# Patient Record
Sex: Female | Born: 1965
Health system: Southern US, Community
[De-identification: ages and names within clinical notes are randomized; demographics above are authoritative.]

## PROBLEM LIST (undated history)

## (undated) DIAGNOSIS — K219 Gastro-esophageal reflux disease without esophagitis: Secondary | ICD-10-CM

## (undated) DIAGNOSIS — C801 Malignant (primary) neoplasm, unspecified: Secondary | ICD-10-CM

## (undated) DIAGNOSIS — M5416 Radiculopathy, lumbar region: Secondary | ICD-10-CM

## (undated) DIAGNOSIS — Z87442 Personal history of urinary calculi: Secondary | ICD-10-CM

## (undated) DIAGNOSIS — M960 Pseudarthrosis after fusion or arthrodesis: Secondary | ICD-10-CM

## (undated) DIAGNOSIS — Z981 Arthrodesis status: Secondary | ICD-10-CM

## (undated) DIAGNOSIS — E785 Hyperlipidemia, unspecified: Secondary | ICD-10-CM

## (undated) DIAGNOSIS — M1990 Unspecified osteoarthritis, unspecified site: Secondary | ICD-10-CM

## (undated) DIAGNOSIS — D649 Anemia, unspecified: Secondary | ICD-10-CM

## (undated) DIAGNOSIS — R51 Headache: Secondary | ICD-10-CM

## (undated) DIAGNOSIS — T4145XA Adverse effect of unspecified anesthetic, initial encounter: Secondary | ICD-10-CM

## (undated) DIAGNOSIS — T753XXA Motion sickness, initial encounter: Secondary | ICD-10-CM

## (undated) DIAGNOSIS — T8859XA Other complications of anesthesia, initial encounter: Secondary | ICD-10-CM

## (undated) DIAGNOSIS — Z87898 Personal history of other specified conditions: Secondary | ICD-10-CM

## (undated) DIAGNOSIS — N189 Chronic kidney disease, unspecified: Secondary | ICD-10-CM

## (undated) DIAGNOSIS — F419 Anxiety disorder, unspecified: Secondary | ICD-10-CM

## (undated) DIAGNOSIS — G43909 Migraine, unspecified, not intractable, without status migrainosus: Secondary | ICD-10-CM

## (undated) DIAGNOSIS — Z9889 Other specified postprocedural states: Secondary | ICD-10-CM

## (undated) DIAGNOSIS — R519 Headache, unspecified: Secondary | ICD-10-CM

## (undated) DIAGNOSIS — R112 Nausea with vomiting, unspecified: Secondary | ICD-10-CM

## (undated) HISTORY — PX: APPENDECTOMY: SHX54

## (undated) HISTORY — PX: LUMBAR SPINE SURGERY: SHX701

## (undated) HISTORY — PX: TONSILLECTOMY: SUR1361

## (undated) HISTORY — PX: RECTOCELE REPAIR: SHX761

## (undated) HISTORY — PX: COLONOSCOPY: SHX174

## (undated) HISTORY — PX: ABDOMINAL HYSTERECTOMY: SHX81

---

## 2004-04-14 ENCOUNTER — Observation Stay (HOSPITAL_COMMUNITY): Admission: RE | Admit: 2004-04-14 | Discharge: 2004-04-15 | Payer: Self-pay | Admitting: Gynecology

## 2007-10-23 ENCOUNTER — Ambulatory Visit: Payer: Self-pay | Admitting: Internal Medicine

## 2009-11-16 ENCOUNTER — Ambulatory Visit: Payer: Self-pay | Admitting: Internal Medicine

## 2010-12-21 ENCOUNTER — Ambulatory Visit: Payer: Self-pay | Admitting: Internal Medicine

## 2011-12-24 ENCOUNTER — Ambulatory Visit: Payer: Self-pay | Admitting: Internal Medicine

## 2013-12-04 ENCOUNTER — Ambulatory Visit: Payer: Self-pay | Admitting: Internal Medicine

## 2014-11-08 ENCOUNTER — Other Ambulatory Visit: Payer: Self-pay | Admitting: Internal Medicine

## 2014-11-08 DIAGNOSIS — Z1231 Encounter for screening mammogram for malignant neoplasm of breast: Secondary | ICD-10-CM

## 2014-12-06 ENCOUNTER — Ambulatory Visit
Admission: RE | Admit: 2014-12-06 | Discharge: 2014-12-06 | Disposition: A | Discharge status: Home / Self Care | Payer: 59 | Source: Ambulatory Visit | Attending: Internal Medicine | Admitting: Internal Medicine

## 2014-12-06 DIAGNOSIS — Z1231 Encounter for screening mammogram for malignant neoplasm of breast: Secondary | ICD-10-CM | POA: Insufficient documentation

## 2015-04-22 ENCOUNTER — Other Ambulatory Visit: Payer: Self-pay | Admitting: Internal Medicine

## 2015-04-22 DIAGNOSIS — R1011 Right upper quadrant pain: Secondary | ICD-10-CM

## 2015-05-04 ENCOUNTER — Ambulatory Visit
Admission: RE | Admit: 2015-05-04 | Discharge: 2015-05-04 | Disposition: A | Discharge status: Home / Self Care | Payer: 59 | Source: Ambulatory Visit | Attending: Internal Medicine | Admitting: Internal Medicine

## 2015-05-04 DIAGNOSIS — K8020 Calculus of gallbladder without cholecystitis without obstruction: Secondary | ICD-10-CM | POA: Insufficient documentation

## 2015-05-04 DIAGNOSIS — K7689 Other specified diseases of liver: Secondary | ICD-10-CM | POA: Diagnosis not present

## 2015-05-04 DIAGNOSIS — R1011 Right upper quadrant pain: Secondary | ICD-10-CM | POA: Insufficient documentation

## 2015-06-09 ENCOUNTER — Encounter: Payer: Self-pay | Admitting: *Deleted

## 2015-06-09 ENCOUNTER — Other Ambulatory Visit: Payer: 59

## 2015-06-09 NOTE — Patient Instructions (Signed)
  Your procedure is scheduled on: 06-16-15 Report to [place] To find out your arrival time please call [phone] between 1PM - 3PM on 06-15-15  Remember: Instructions that are not followed completely may result in serious medical risk, up to and including death, or upon the discretion of your surgeon and anesthesiologist your surgery may need to be rescheduled.    _X___ 1. Do not eat food or drink liquids after midnight. No gum chewing or hard candies.     _X___ 2. No Alcohol for 24 hours before or after surgery.   ____ 3. Bring all medications with you on the day of surgery if instructed.    ____ 4. Notify your doctor if there is any change in your medical condition     (cold, fever, infections).     Do not wear jewelry, make-up, hairpins, clips or nail polish.  Do not wear lotions, powders, or perfumes. You may wear deodorant.  Do not shave 48 hours prior to surgery. Men may shave face and neck.  Do not bring valuables to the hospital.    [facility] is not responsible for any belongings or valuables.               Contacts, dentures or bridgework may not be worn into surgery.  Leave your suitcase in the car. After surgery it may be brought to your room.  For patients admitted to the hospital, discharge time is determined by your treatment team.   Patients discharged the day of surgery will not be allowed to drive home.   Please read over the following fact sheets that you were given:     _X___ Take these medicines the morning of surgery with A SIP OF WATER:    1. PROTONIX  2.   3.   4.  5.  6.  ____ Fleet Enema (as directed)   ____ Use CHG Soap as directed  ____ Use inhalers on the day of surgery  ____ Stop metformin 2 days prior to surgery    ____ Take 1/2 of usual insulin dose the night before surgery and none on the morning of surgery.   ____ Stop Coumadin/Plavix/aspirin-N/A  _X___ Stop Anti-inflammatories-NO NSAIDS OR ASA  PRODUCTS-TYLENOL OK TO TAKE   _X___ Stop supplements until after surgery-STOP TRUBIOTIC NOW   ____ Bring C-Pap to the hospital.

## 2015-06-16 ENCOUNTER — Ambulatory Visit: Payer: Commercial Managed Care - HMO | Admitting: Certified Registered"

## 2015-06-16 ENCOUNTER — Ambulatory Visit: Payer: Commercial Managed Care - HMO

## 2015-06-16 ENCOUNTER — Encounter
Admission: RE | Disposition: A | Discharge status: Home / Self Care | Payer: Self-pay | Source: Ambulatory Visit | Attending: Surgery

## 2015-06-16 ENCOUNTER — Ambulatory Visit
Admission: RE | Admit: 2015-06-16 | Discharge: 2015-06-16 | Disposition: A | Discharge status: Home / Self Care | Payer: Commercial Managed Care - HMO | Source: Ambulatory Visit | Attending: Surgery | Admitting: Surgery

## 2015-06-16 DIAGNOSIS — Z862 Personal history of diseases of the blood and blood-forming organs and certain disorders involving the immune mechanism: Secondary | ICD-10-CM | POA: Diagnosis not present

## 2015-06-16 DIAGNOSIS — K219 Gastro-esophageal reflux disease without esophagitis: Secondary | ICD-10-CM | POA: Diagnosis not present

## 2015-06-16 DIAGNOSIS — K8010 Calculus of gallbladder with chronic cholecystitis without obstruction: Secondary | ICD-10-CM | POA: Diagnosis not present

## 2015-06-16 DIAGNOSIS — F419 Anxiety disorder, unspecified: Secondary | ICD-10-CM | POA: Diagnosis not present

## 2015-06-16 DIAGNOSIS — K819 Cholecystitis, unspecified: Secondary | ICD-10-CM

## 2015-06-16 HISTORY — DX: Gastro-esophageal reflux disease without esophagitis: K21.9

## 2015-06-16 HISTORY — DX: Unspecified osteoarthritis, unspecified site: M19.90

## 2015-06-16 HISTORY — DX: Headache: R51

## 2015-06-16 HISTORY — DX: Adverse effect of unspecified anesthetic, initial encounter: T41.45XA

## 2015-06-16 HISTORY — PX: CHOLECYSTECTOMY: SHX55

## 2015-06-16 HISTORY — DX: Hyperlipidemia, unspecified: E78.5

## 2015-06-16 HISTORY — DX: Anemia, unspecified: D64.9

## 2015-06-16 HISTORY — DX: Anxiety disorder, unspecified: F41.9

## 2015-06-16 HISTORY — DX: Radiculopathy, lumbar region: M54.16

## 2015-06-16 HISTORY — DX: Chronic kidney disease, unspecified: N18.9

## 2015-06-16 HISTORY — DX: Other complications of anesthesia, initial encounter: T88.59XA

## 2015-06-16 HISTORY — DX: Other specified postprocedural states: Z98.890

## 2015-06-16 HISTORY — DX: Other specified postprocedural states: R11.2

## 2015-06-16 HISTORY — DX: Headache, unspecified: R51.9

## 2015-06-16 SURGERY — LAPAROSCOPIC CHOLECYSTECTOMY WITH INTRAOPERATIVE CHOLANGIOGRAM
Anesthesia: General | Wound class: Clean Contaminated

## 2015-06-16 MED ORDER — PROPOFOL 10 MG/ML IV BOLUS
INTRAVENOUS | Status: DC | PRN
  Administered 2015-06-16: 130 mg via INTRAVENOUS
  Administered 2015-06-16: 40 mg via INTRAVENOUS

## 2015-06-16 MED ORDER — MIDAZOLAM HCL 2 MG/2ML IJ SOLN
INTRAMUSCULAR | Status: DC | PRN
  Administered 2015-06-16: 2 mg via INTRAVENOUS

## 2015-06-16 MED ORDER — ONDANSETRON HCL 4 MG/2ML IJ SOLN
INTRAMUSCULAR | Status: DC | PRN
  Administered 2015-06-16: 4 mg via INTRAVENOUS

## 2015-06-16 MED ORDER — HYDROCODONE-ACETAMINOPHEN 5-325 MG PO TABS: 1.0000 | ORAL_TABLET | ORAL | Status: DC | PRN

## 2015-06-16 MED ORDER — ROCURONIUM BROMIDE 100 MG/10ML IV SOLN
INTRAVENOUS | Status: DC | PRN
  Administered 2015-06-16 (×2): 5 mg via INTRAVENOUS
  Administered 2015-06-16: 35 mg via INTRAVENOUS
  Administered 2015-06-16: 5 mg via INTRAVENOUS

## 2015-06-16 MED ORDER — PHENYLEPHRINE HCL 10 MG/ML IJ SOLN
INTRAMUSCULAR | Status: DC | PRN
  Administered 2015-06-16: 50 ug via INTRAVENOUS

## 2015-06-16 MED ORDER — FENTANYL CITRATE (PF) 100 MCG/2ML IJ SOLN
INTRAMUSCULAR | Status: AC
  Filled 2015-06-16: qty 2

## 2015-06-16 MED ORDER — ONDANSETRON HCL 4 MG/2ML IJ SOLN
INTRAMUSCULAR | Status: AC
  Filled 2015-06-16: qty 2

## 2015-06-16 MED ORDER — PROMETHAZINE HCL 12.5 MG PO TABS: 12.5000 mg | ORAL_TABLET | ORAL | Status: DC | PRN

## 2015-06-16 MED ORDER — LACTATED RINGERS IV SOLN
INTRAVENOUS | Status: DC
  Administered 2015-06-16: 07:00:00 via INTRAVENOUS

## 2015-06-16 MED ORDER — EPHEDRINE SULFATE 50 MG/ML IJ SOLN
INTRAMUSCULAR | Status: DC | PRN
  Administered 2015-06-16: 10 mg via INTRAVENOUS

## 2015-06-16 MED ORDER — FENTANYL CITRATE (PF) 100 MCG/2ML IJ SOLN
25.0000 ug | INTRAMUSCULAR | Status: DC | PRN
  Administered 2015-06-16 (×2): 25 ug via INTRAVENOUS

## 2015-06-16 MED ORDER — PROMETHAZINE HCL 25 MG PO TABS
25.0000 mg | ORAL_TABLET | Freq: Once | ORAL | Status: AC
  Administered 2015-06-16: 25 mg via ORAL
  Filled 2015-06-16: qty 1

## 2015-06-16 MED ORDER — ONDANSETRON HCL 4 MG/2ML IJ SOLN
4.0000 mg | Freq: Once | INTRAMUSCULAR | Status: AC | PRN
  Administered 2015-06-16: 4 mg via INTRAVENOUS

## 2015-06-16 MED ORDER — HEPARIN SODIUM (PORCINE) 5000 UNIT/ML IJ SOLN
INTRAMUSCULAR | Status: AC
  Filled 2015-06-16: qty 1

## 2015-06-16 MED ORDER — GLYCOPYRROLATE 0.2 MG/ML IJ SOLN
INTRAMUSCULAR | Status: DC | PRN
  Administered 2015-06-16: 0.2 mg via INTRAVENOUS

## 2015-06-16 MED ORDER — DEXAMETHASONE SODIUM PHOSPHATE 10 MG/ML IJ SOLN
INTRAMUSCULAR | Status: DC | PRN
  Administered 2015-06-16: 10 mg via INTRAVENOUS

## 2015-06-16 MED ORDER — SUGAMMADEX SODIUM 200 MG/2ML IV SOLN
INTRAVENOUS | Status: DC | PRN
  Administered 2015-06-16: 150 mg via INTRAVENOUS

## 2015-06-16 MED ORDER — FENTANYL CITRATE (PF) 100 MCG/2ML IJ SOLN
INTRAMUSCULAR | Status: DC | PRN
  Administered 2015-06-16 (×5): 50 ug via INTRAVENOUS

## 2015-06-16 MED ORDER — LIDOCAINE HCL (CARDIAC) 20 MG/ML IV SOLN
INTRAVENOUS | Status: DC | PRN
  Administered 2015-06-16: 60 mg via INTRAVENOUS

## 2015-06-16 SURGICAL SUPPLY — 39 items
APPLIER CLIP ROT 10 11.4 M/L (STAPLE) ×2
APR CLP MED LRG 11.4X10 (STAPLE) ×1
CANISTER SUCT 1200ML W/VALVE (MISCELLANEOUS) ×2 IMPLANT
CANNULA DILATOR 10 W/SLV (CANNULA) ×2 IMPLANT
CATH REDDICK CHOLANGI 4FR 50CM (CATHETERS) ×2 IMPLANT
CHLORAPREP W/TINT 26ML (MISCELLANEOUS) ×2 IMPLANT
CHOLANGIOGRAM CATH TAUT (CATHETERS) ×1 IMPLANT
CLIP APPLIE ROT 10 11.4 M/L (STAPLE) ×1 IMPLANT
DRAPE SHEET LG 3/4 BI-LAMINATE (DRAPES) ×2 IMPLANT
ELECT REM PT RETURN 9FT ADLT (ELECTROSURGICAL) ×2
ELECTRODE REM PT RTRN 9FT ADLT (ELECTROSURGICAL) ×1 IMPLANT
GAUZE SPONGE 4X4 12PLY STRL (GAUZE/BANDAGES/DRESSINGS) ×2 IMPLANT
GLOVE BIO SURGEON STRL SZ7.5 (GLOVE) ×2 IMPLANT
GOWN STRL REUS W/ TWL LRG LVL3 (GOWN DISPOSABLE) ×4 IMPLANT
GOWN STRL REUS W/TWL LRG LVL3 (GOWN DISPOSABLE) ×8
IRRIGATION STRYKERFLOW (MISCELLANEOUS) ×1 IMPLANT
IRRIGATOR STRYKERFLOW (MISCELLANEOUS) ×2
IV NS 1000ML (IV SOLUTION) ×2
IV NS 1000ML BAXH (IV SOLUTION) ×1 IMPLANT
KIT RM TURNOVER STRD PROC AR (KITS) ×2 IMPLANT
LABEL OR SOLS (LABEL) ×2 IMPLANT
LIQUID BAND (GAUZE/BANDAGES/DRESSINGS) ×1 IMPLANT
NDL FILTER BLUNT 18X1 1/2 (NEEDLE) ×1 IMPLANT
NDL INSUFF ACCESS 14 VERSASTEP (NEEDLE) ×2 IMPLANT
NEEDLE FILTER BLUNT 18X 1/2SAF (NEEDLE) ×1
NEEDLE FILTER BLUNT 18X1 1/2 (NEEDLE) ×1 IMPLANT
NS IRRIG 500ML POUR BTL (IV SOLUTION) ×2 IMPLANT
PACK LAP CHOLECYSTECTOMY (MISCELLANEOUS) ×2 IMPLANT
SCISSORS METZENBAUM CVD 33 (INSTRUMENTS) ×2 IMPLANT
SEAL FOR SCOPE WARMER C3101 (MISCELLANEOUS) ×2 IMPLANT
SLEEVE ENDOPATH XCEL 5M (ENDOMECHANICALS) ×2 IMPLANT
STRIP CLOSURE SKIN 1/2X4 (GAUZE/BANDAGES/DRESSINGS) ×2 IMPLANT
SUT CHROMIC 5 0 RB 1 27 (SUTURE) ×2 IMPLANT
SUT VIC AB 0 CT2 27 (SUTURE) IMPLANT
SYR 3ML LL SCALE MARK (SYRINGE) ×2 IMPLANT
TROCAR XCEL NON-BLD 11X100MML (ENDOMECHANICALS) ×2 IMPLANT
TROCAR XCEL NON-BLD 5MMX100MML (ENDOMECHANICALS) ×2 IMPLANT
TUBING INSUFFLATOR HI FLOW (MISCELLANEOUS) ×2 IMPLANT
WATER STERILE IRR 1000ML POUR (IV SOLUTION) ×2 IMPLANT

## 2015-06-16 NOTE — H&P (Signed)
  She continues to have some epigastric pains.  No change in condition since office exam.  Labs noted.  Discussed plan for lap cholecystectomy.

## 2015-06-16 NOTE — Transfer of Care (Signed)
Immediate Anesthesia Transfer of Care Note  Patient: Kristin Thomas  Procedure(s) Performed: Procedure(s): LAPAROSCOPIC CHOLECYSTECTOMY WITH INTRAOPERATIVE CHOLANGIOGRAM (N/A)  Patient Location: PACU  Anesthesia Type:General  Level of Consciousness: awake, alert  and oriented  Airway & Oxygen Therapy: Patient Spontanous Breathing and Patient connected to nasal cannula oxygen  Post-op Assessment: Report given to RN and Post -op Vital signs reviewed and stable  Post vital signs: Reviewed and stable  Last Vitals:  Filed Vitals:   06/16/15 0611 06/16/15 0907  BP: 130/77 142/79  Pulse: 67 91  Temp: 36.7 C 36.4 C  Resp: 16 13    Last Pain: There were no vitals filed for this visit.       Complications: No apparent anesthesia complications

## 2015-06-16 NOTE — Anesthesia Procedure Notes (Signed)
Procedure Name: Intubation Date/Time: 06/16/2015 7:32 AM Performed by: Jenetta Downer Pre-anesthesia Checklist: Patient identified, Emergency Drugs available, Suction available and Patient being monitored Patient Re-evaluated:Patient Re-evaluated prior to inductionOxygen Delivery Method: Circle system utilized Preoxygenation: Pre-oxygenation with 100% oxygen Intubation Type: IV induction Ventilation: Mask ventilation without difficulty Laryngoscope Size: Miller and 2 Grade View: Grade I Tube type: Oral Tube size: 6.5 mm Number of attempts: 1 Airway Equipment and Method: Stylet Placement Confirmation: ETT inserted through vocal cords under direct vision,  positive ETCO2 and breath sounds checked- equal and bilateral Secured at: 22 cm Tube secured with: Tape

## 2015-06-16 NOTE — Op Note (Signed)
OPERATIVE REPORT  PREOPERATIVE DIAGNOSIS:  Chronic cholecystitis cholelithiasis  POSTOPERATIVE DIAGNOSIS: Chronic cholecystitis cholelithiasis  PROCEDURE: Laparoscopic cholecystectomy with cholangiogram  ANESTHESIA: General  SURGEON: Rochel Brome M.D.  INDICATIONS: She has a history of epigastric pain and ultrasound findings of gallstones.    With the patient on the operating table in the supine position under general endotracheal anesthesia the abdomen was prepared with ChloraPrep solution and draped in a sterile manner. A short incision was made in the inferior aspect of the umbilicus and carried down to the deep fascia which was grasped with a laryngeal hook. A Veress needle was inserted aspirated and irrigated with a saline solution. The peritoneal cavity was insufflated with carbon dioxide. The Veress needle was removed. The 10 mm cannula was inserted. The 10 mm 0 laparoscope was inserted to view the peritoneal cavity.  Another incision was made in the epigastrium slightly to the right of the midline to introduce an 11 mm cannula. 2 incisions were made in the lateral aspect of the right upper quadrant to introduce 2   5 mm cannulas. Initial inspection revealed the smooth surface of the liver. There were a few adhesions between the omentum and the lower abdominal wall. The gallbladder was retracted towards the right shoulder. A number of adhesions were taken down with hook and cautery and blunt dissection.  The gallbladder neck was retracted inferiorly and laterally.  The porta hepatis was identified. The gallbladder was mobilized with incision of the visceral peritoneum. The cystic duct was dissected free from surrounding structures. The cystic artery was dissected free from surrounding structures. A critical view of safety was demonstrated  An Endo Clip was placed across the cystic duct adjacent to the gallbladder neck. An incision was made in the cystic duct to introduce a Reddick catheter.  The cholangiogram was done with injection of half-strength Conray 60 dye. This demonstrated the bile ducts and flow of dye into the duodenum. No retained stones were identified. The cholangiogram appeared normal. The Reddick catheter was removed. The cystic duct was doubly ligated with endoclips and divided. The cystic artery was controlled with double endoclips and divided. The gallbladder was dissected free from the liver with use of hook and cautery and blunt dissection. Bleeding was minimal and hemostasis was intact. The gallbladder was delivered up through the infraumbilical incision opened and suctioned.  Multiple stones were removed with the stone forceps and stone scoop. The gallbladder was submitted with stones in formalin for routine pathology. The skin incisions were closed with interrupted 5-0 chromic subcutaneous suture benzoin and Steri-Strips. Gauze dressings were applied with paper tape.  The patient appeared to be in satisfactory condition and was prepared for transfer to the recovery room  [place].D.

## 2015-06-16 NOTE — Anesthesia Preprocedure Evaluation (Signed)
Anesthesia Evaluation  Patient identified by MRN, date of birth, ID band Patient awake    History of Anesthesia Complications (+) PONV  Airway Mallampati: II       Dental  (+) Teeth Intact   Pulmonary neg pulmonary ROS,    breath sounds clear to auscultation       Cardiovascular Exercise Tolerance: Good  Rhythm:Regular Rate:Normal     Neuro/Psych  Headaches, Anxiety    GI/Hepatic Neg liver ROS, GERD  Medicated,  Endo/Other  negative endocrine ROS  Renal/GU negative Renal ROS     Musculoskeletal   Abdominal Normal abdominal exam  (+)   Peds  Hematology  (+) anemia ,   Anesthesia Other Findings   Reproductive/Obstetrics                             Anesthesia Physical Anesthesia Plan  ASA: II  Anesthesia Plan: General   Post-op Pain Management:    Induction: Intravenous  Airway Management Planned: Oral ETT  Additional Equipment:   Intra-op Plan:   Post-operative Plan: Extubation in OR  Informed Consent: I have reviewed the patients History and Physical, chart, labs and discussed the procedure including the risks, benefits and alternatives for the proposed anesthesia with the patient or authorized representative who has indicated his/her understanding and acceptance.     Plan Discussed with: CRNA  Anesthesia Plan Comments:         Anesthesia Quick Evaluation

## 2015-06-16 NOTE — Discharge Instructions (Addendum)
Take Tylenol or Norco if needed for pain.  Take Phenergan 1 tablet each 4 hours as needed for nausea.  Remove dressings on Friday. May shower Saturday.  Avoid straining and heavy lifting for the first week after surgery  .AMBULATORY SURGERY  DISCHARGE INSTRUCTIONS   1) The drugs that you were given will stay in your system until tomorrow so for the next 24 hours you should not:  A) Drive an automobile B) Make any legal decisions C) Drink any alcoholic beverage   2) You may resume regular meals tomorrow.  Today it is better to start with liquids and gradually work up to solid foods.  You may eat anything you prefer, but it is better to start with liquids, then soup and crackers, and gradually work up to solid foods.   3) Please notify your doctor immediately if you have any unusual bleeding, trouble breathing, redness and pain at the surgery site, drainage, fever, or pain not relieved by medication.    4) Additional Instructions:        Please contact your physician with any problems or Same Day Surgery at [phone], Monday through Friday 6 am to 4 pm, or [place] [facility] number at [phone].

## 2015-06-16 NOTE — Anesthesia Postprocedure Evaluation (Signed)
Anesthesia Post Note  Patient: Kristin Thomas  Procedure(s) Performed: Procedure(s) (LRB): LAPAROSCOPIC CHOLECYSTECTOMY WITH INTRAOPERATIVE CHOLANGIOGRAM (N/A)  Patient location during evaluation: PACU Anesthesia Type: General Level of consciousness: awake Pain management: pain level controlled Vital Signs Assessment: post-procedure vital signs reviewed and stable Respiratory status: spontaneous breathing Cardiovascular status: blood pressure returned to baseline Anesthetic complications: no    Last Vitals:  Filed Vitals:   06/16/15 0907 06/16/15 0915  BP: 142/79   Pulse: 91 71  Temp: 36.4 C   Resp: 13 9    Last Pain:  Filed Vitals:   06/16/15 0923  PainSc: 5                  VAN STAVEREN,Violetta Lavalle

## 2015-06-16 NOTE — OR Nursing (Signed)
Discharge instructions given to significant other Luna Kitchens

## 2015-06-17 LAB — SURGICAL PATHOLOGY

## 2015-11-17 ENCOUNTER — Other Ambulatory Visit: Payer: Self-pay | Admitting: Internal Medicine

## 2015-11-17 DIAGNOSIS — Z1231 Encounter for screening mammogram for malignant neoplasm of breast: Secondary | ICD-10-CM

## 2015-11-30 ENCOUNTER — Other Ambulatory Visit: Payer: Self-pay | Admitting: Gastroenterology

## 2015-11-30 DIAGNOSIS — K219 Gastro-esophageal reflux disease without esophagitis: Secondary | ICD-10-CM

## 2015-11-30 DIAGNOSIS — R1310 Dysphagia, unspecified: Secondary | ICD-10-CM

## 2015-12-02 ENCOUNTER — Ambulatory Visit
Admission: RE | Admit: 2015-12-02 | Discharge: 2015-12-02 | Disposition: A | Discharge status: Home / Self Care | Payer: Commercial Managed Care - HMO | Source: Ambulatory Visit | Attending: Gastroenterology | Admitting: Gastroenterology

## 2015-12-02 DIAGNOSIS — K219 Gastro-esophageal reflux disease without esophagitis: Secondary | ICD-10-CM | POA: Insufficient documentation

## 2015-12-02 DIAGNOSIS — R1310 Dysphagia, unspecified: Secondary | ICD-10-CM | POA: Diagnosis not present

## 2015-12-20 ENCOUNTER — Ambulatory Visit
Admission: RE | Admit: 2015-12-20 | Discharge: 2015-12-20 | Disposition: A | Discharge status: Home / Self Care | Payer: Commercial Managed Care - HMO | Source: Ambulatory Visit | Attending: Internal Medicine | Admitting: Internal Medicine

## 2015-12-20 DIAGNOSIS — Z1231 Encounter for screening mammogram for malignant neoplasm of breast: Secondary | ICD-10-CM | POA: Insufficient documentation

## 2016-03-07 DIAGNOSIS — J40 Bronchitis, not specified as acute or chronic: Secondary | ICD-10-CM | POA: Diagnosis not present

## 2016-03-07 DIAGNOSIS — J4522 Mild intermittent asthma with status asthmaticus: Secondary | ICD-10-CM | POA: Diagnosis not present

## 2016-03-08 ENCOUNTER — Encounter: Admission: RE | Payer: Self-pay | Source: Ambulatory Visit

## 2016-03-08 ENCOUNTER — Ambulatory Visit: Admission: RE | Admit: 2016-03-08 | Payer: 59 | Source: Ambulatory Visit | Admitting: Unknown Physician Specialty

## 2016-03-08 DIAGNOSIS — Z1211 Encounter for screening for malignant neoplasm of colon: Secondary | ICD-10-CM | POA: Diagnosis not present

## 2016-03-08 SURGERY — ESOPHAGOGASTRODUODENOSCOPY (EGD) WITH PROPOFOL
Anesthesia: General

## 2016-03-23 ENCOUNTER — Encounter: Admission: RE | Payer: Self-pay | Source: Ambulatory Visit

## 2016-03-23 ENCOUNTER — Ambulatory Visit
Admission: RE | Admit: 2016-03-23 | Payer: Commercial Managed Care - HMO | Source: Ambulatory Visit | Admitting: Gastroenterology

## 2016-03-23 SURGERY — COLONOSCOPY WITH PROPOFOL
Anesthesia: General

## 2016-04-05 DIAGNOSIS — K2950 Unspecified chronic gastritis without bleeding: Secondary | ICD-10-CM | POA: Diagnosis not present

## 2016-04-05 DIAGNOSIS — R1310 Dysphagia, unspecified: Secondary | ICD-10-CM | POA: Diagnosis not present

## 2016-04-05 DIAGNOSIS — K449 Diaphragmatic hernia without obstruction or gangrene: Secondary | ICD-10-CM | POA: Diagnosis not present

## 2016-04-05 DIAGNOSIS — K2980 Duodenitis without bleeding: Secondary | ICD-10-CM | POA: Diagnosis not present

## 2016-04-05 DIAGNOSIS — K2960 Other gastritis without bleeding: Secondary | ICD-10-CM | POA: Diagnosis not present

## 2016-05-22 DIAGNOSIS — R51 Headache: Secondary | ICD-10-CM | POA: Diagnosis not present

## 2016-05-22 DIAGNOSIS — M9902 Segmental and somatic dysfunction of thoracic region: Secondary | ICD-10-CM | POA: Diagnosis not present

## 2016-05-22 DIAGNOSIS — M9901 Segmental and somatic dysfunction of cervical region: Secondary | ICD-10-CM | POA: Diagnosis not present

## 2016-06-26 DIAGNOSIS — R51 Headache: Secondary | ICD-10-CM | POA: Diagnosis not present

## 2016-06-26 DIAGNOSIS — M9901 Segmental and somatic dysfunction of cervical region: Secondary | ICD-10-CM | POA: Diagnosis not present

## 2016-06-26 DIAGNOSIS — M9902 Segmental and somatic dysfunction of thoracic region: Secondary | ICD-10-CM | POA: Diagnosis not present

## 2016-07-24 DIAGNOSIS — M9901 Segmental and somatic dysfunction of cervical region: Secondary | ICD-10-CM | POA: Diagnosis not present

## 2016-07-24 DIAGNOSIS — M9902 Segmental and somatic dysfunction of thoracic region: Secondary | ICD-10-CM | POA: Diagnosis not present

## 2016-07-24 DIAGNOSIS — R51 Headache: Secondary | ICD-10-CM | POA: Diagnosis not present

## 2016-08-01 DIAGNOSIS — R51 Headache: Secondary | ICD-10-CM | POA: Diagnosis not present

## 2016-08-01 DIAGNOSIS — M9902 Segmental and somatic dysfunction of thoracic region: Secondary | ICD-10-CM | POA: Diagnosis not present

## 2016-08-01 DIAGNOSIS — M9901 Segmental and somatic dysfunction of cervical region: Secondary | ICD-10-CM | POA: Diagnosis not present

## 2016-08-09 ENCOUNTER — Ambulatory Visit
Admission: RE | Admit: 2016-08-09 | Discharge: 2016-08-09 | Disposition: A | Discharge status: Home / Self Care | Payer: Commercial Managed Care - HMO | Source: Ambulatory Visit | Attending: Physician Assistant | Admitting: Physician Assistant

## 2016-08-09 ENCOUNTER — Other Ambulatory Visit: Payer: Self-pay | Admitting: Physician Assistant

## 2016-08-09 DIAGNOSIS — N200 Calculus of kidney: Secondary | ICD-10-CM | POA: Diagnosis not present

## 2016-08-09 DIAGNOSIS — R319 Hematuria, unspecified: Secondary | ICD-10-CM | POA: Diagnosis present

## 2016-08-09 DIAGNOSIS — R1031 Right lower quadrant pain: Secondary | ICD-10-CM | POA: Diagnosis present

## 2016-08-09 DIAGNOSIS — M545 Low back pain, unspecified: Secondary | ICD-10-CM

## 2016-08-09 DIAGNOSIS — R300 Dysuria: Secondary | ICD-10-CM | POA: Diagnosis not present

## 2016-11-02 DIAGNOSIS — Z0000 Encounter for general adult medical examination without abnormal findings: Secondary | ICD-10-CM | POA: Diagnosis not present

## 2016-11-13 ENCOUNTER — Other Ambulatory Visit: Payer: Self-pay | Admitting: Internal Medicine

## 2016-11-13 DIAGNOSIS — Z0000 Encounter for general adult medical examination without abnormal findings: Secondary | ICD-10-CM | POA: Diagnosis not present

## 2016-11-13 DIAGNOSIS — M5116 Intervertebral disc disorders with radiculopathy, lumbar region: Secondary | ICD-10-CM | POA: Diagnosis not present

## 2016-11-13 DIAGNOSIS — Z1231 Encounter for screening mammogram for malignant neoplasm of breast: Secondary | ICD-10-CM

## 2016-11-13 DIAGNOSIS — Z23 Encounter for immunization: Secondary | ICD-10-CM | POA: Diagnosis not present

## 2016-11-13 DIAGNOSIS — K219 Gastro-esophageal reflux disease without esophagitis: Secondary | ICD-10-CM | POA: Diagnosis not present

## 2016-12-13 DIAGNOSIS — Z23 Encounter for immunization: Secondary | ICD-10-CM | POA: Diagnosis not present

## 2016-12-13 DIAGNOSIS — D500 Iron deficiency anemia secondary to blood loss (chronic): Secondary | ICD-10-CM | POA: Diagnosis not present

## 2016-12-13 DIAGNOSIS — K219 Gastro-esophageal reflux disease without esophagitis: Secondary | ICD-10-CM | POA: Diagnosis not present

## 2016-12-20 ENCOUNTER — Ambulatory Visit
Admission: RE | Admit: 2016-12-20 | Discharge: 2016-12-20 | Disposition: A | Discharge status: Home / Self Care | Payer: 59 | Source: Ambulatory Visit | Attending: Internal Medicine | Admitting: Internal Medicine

## 2016-12-20 DIAGNOSIS — Z1231 Encounter for screening mammogram for malignant neoplasm of breast: Secondary | ICD-10-CM | POA: Insufficient documentation

## 2016-12-21 DIAGNOSIS — K219 Gastro-esophageal reflux disease without esophagitis: Secondary | ICD-10-CM | POA: Diagnosis not present

## 2017-01-24 ENCOUNTER — Other Ambulatory Visit: Payer: Self-pay | Admitting: Gastroenterology

## 2017-01-24 DIAGNOSIS — R12 Heartburn: Secondary | ICD-10-CM | POA: Diagnosis not present

## 2017-01-24 DIAGNOSIS — K219 Gastro-esophageal reflux disease without esophagitis: Secondary | ICD-10-CM | POA: Diagnosis not present

## 2017-02-11 ENCOUNTER — Ambulatory Visit (HOSPITAL_COMMUNITY)
Admission: RE | Admit: 2017-02-11 | Discharge: 2017-02-11 | Disposition: A | Discharge status: Home / Self Care | Payer: 59 | Source: Ambulatory Visit | Attending: Gastroenterology | Admitting: Gastroenterology

## 2017-02-11 ENCOUNTER — Encounter (HOSPITAL_COMMUNITY)
Admission: RE | Disposition: A | Discharge status: Home / Self Care | Payer: Self-pay | Source: Ambulatory Visit | Attending: Gastroenterology

## 2017-02-11 DIAGNOSIS — R12 Heartburn: Secondary | ICD-10-CM | POA: Insufficient documentation

## 2017-02-11 DIAGNOSIS — J029 Acute pharyngitis, unspecified: Secondary | ICD-10-CM | POA: Diagnosis present

## 2017-02-11 DIAGNOSIS — H9209 Otalgia, unspecified ear: Secondary | ICD-10-CM | POA: Insufficient documentation

## 2017-02-11 DIAGNOSIS — R079 Chest pain, unspecified: Secondary | ICD-10-CM | POA: Insufficient documentation

## 2017-02-11 HISTORY — PX: ESOPHAGEAL MANOMETRY: SHX5429

## 2017-02-11 HISTORY — PX: PH IMPEDANCE STUDY: SHX5565

## 2017-02-11 SURGERY — MANOMETRY, ESOPHAGUS

## 2017-02-11 MED ORDER — LIDOCAINE VISCOUS 2 % MT SOLN
OROMUCOSAL | Status: AC
  Filled 2017-02-11: qty 15

## 2017-02-11 SURGICAL SUPPLY — 2 items
FACESHIELD LNG OPTICON STERILE (SAFETY) IMPLANT
GLOVE BIO SURGEON STRL SZ8 (GLOVE) ×4 IMPLANT

## 2017-02-11 NOTE — Progress Notes (Signed)
Esophageal manometry done per protocol.  Patient tolerated well.  pH probe then placed 5 cm above LES at 36 cm.  Patient tolerated well.  Patient was instructed on use of digitrapper, documentaion and when to return.  Patient verbalized understanding and will return tomorrow morning at 0925.  Dr Michail Sermon will be notified of studies after patient returns tomorrow.

## 2017-02-13 ENCOUNTER — Encounter (HOSPITAL_COMMUNITY): Payer: Self-pay | Admitting: Gastroenterology

## 2017-02-14 DIAGNOSIS — R12 Heartburn: Secondary | ICD-10-CM | POA: Diagnosis not present

## 2017-02-14 DIAGNOSIS — K219 Gastro-esophageal reflux disease without esophagitis: Secondary | ICD-10-CM | POA: Diagnosis not present

## 2017-02-20 ENCOUNTER — Telehealth: Payer: Self-pay | Admitting: Surgery

## 2017-02-20 NOTE — Telephone Encounter (Signed)
Called to go over ph probe and mano. The results are more consistent with hypertensive LES (as are her symptoms) than reflux. She states she has been off her PPI since 2 weeks before the studies and actually her dysphagia/ chest pain symptoms are a little better, but still has the ear and throat pain. Will refer back to Gi for medical mgmt of hypertensive LES and to ENT for evaluation of ear and throat pain.

## 2017-02-27 DIAGNOSIS — J029 Acute pharyngitis, unspecified: Secondary | ICD-10-CM | POA: Diagnosis not present

## 2017-02-27 DIAGNOSIS — H9203 Otalgia, bilateral: Secondary | ICD-10-CM | POA: Diagnosis not present

## 2017-02-27 DIAGNOSIS — K219 Gastro-esophageal reflux disease without esophagitis: Secondary | ICD-10-CM | POA: Diagnosis not present

## 2017-04-04 DIAGNOSIS — D500 Iron deficiency anemia secondary to blood loss (chronic): Secondary | ICD-10-CM | POA: Diagnosis not present

## 2017-04-11 DIAGNOSIS — K219 Gastro-esophageal reflux disease without esophagitis: Secondary | ICD-10-CM | POA: Diagnosis not present

## 2017-04-11 DIAGNOSIS — J3501 Chronic tonsillitis: Secondary | ICD-10-CM | POA: Diagnosis not present

## 2017-04-17 DIAGNOSIS — H9203 Otalgia, bilateral: Secondary | ICD-10-CM | POA: Diagnosis not present

## 2017-04-17 DIAGNOSIS — J3501 Chronic tonsillitis: Secondary | ICD-10-CM | POA: Diagnosis not present

## 2017-04-24 ENCOUNTER — Other Ambulatory Visit: Payer: Self-pay | Admitting: Otolaryngology

## 2017-04-24 DIAGNOSIS — H9209 Otalgia, unspecified ear: Secondary | ICD-10-CM

## 2017-04-26 ENCOUNTER — Ambulatory Visit
Admission: RE | Admit: 2017-04-26 | Discharge: 2017-04-26 | Disposition: A | Discharge status: Home / Self Care | Payer: 59 | Source: Ambulatory Visit | Attending: Otolaryngology | Admitting: Otolaryngology

## 2017-04-26 DIAGNOSIS — J039 Acute tonsillitis, unspecified: Secondary | ICD-10-CM | POA: Diagnosis not present

## 2017-04-26 DIAGNOSIS — H9209 Otalgia, unspecified ear: Secondary | ICD-10-CM

## 2017-04-26 MED ORDER — IOPAMIDOL (ISOVUE-300) INJECTION 61%
75.0000 mL | Freq: Once | INTRAVENOUS | Status: AC | PRN
  Administered 2017-04-26: 75 mL via INTRAVENOUS

## 2017-05-10 DIAGNOSIS — J3501 Chronic tonsillitis: Secondary | ICD-10-CM | POA: Diagnosis not present

## 2017-05-10 DIAGNOSIS — H9203 Otalgia, bilateral: Secondary | ICD-10-CM | POA: Diagnosis not present

## 2017-05-10 DIAGNOSIS — J351 Hypertrophy of tonsils: Secondary | ICD-10-CM | POA: Diagnosis not present

## 2017-06-13 DIAGNOSIS — M79641 Pain in right hand: Secondary | ICD-10-CM | POA: Diagnosis not present

## 2017-06-13 DIAGNOSIS — M1990 Unspecified osteoarthritis, unspecified site: Secondary | ICD-10-CM | POA: Diagnosis not present

## 2017-06-27 DIAGNOSIS — M1990 Unspecified osteoarthritis, unspecified site: Secondary | ICD-10-CM | POA: Diagnosis not present

## 2017-06-27 DIAGNOSIS — M79641 Pain in right hand: Secondary | ICD-10-CM | POA: Diagnosis not present

## 2017-11-11 DIAGNOSIS — K219 Gastro-esophageal reflux disease without esophagitis: Secondary | ICD-10-CM | POA: Diagnosis not present

## 2017-11-11 DIAGNOSIS — D649 Anemia, unspecified: Secondary | ICD-10-CM | POA: Diagnosis not present

## 2017-11-11 DIAGNOSIS — Z0000 Encounter for general adult medical examination without abnormal findings: Secondary | ICD-10-CM | POA: Diagnosis not present

## 2017-11-18 DIAGNOSIS — Z23 Encounter for immunization: Secondary | ICD-10-CM | POA: Diagnosis not present

## 2017-11-18 DIAGNOSIS — M11241 Other chondrocalcinosis, right hand: Secondary | ICD-10-CM | POA: Diagnosis not present

## 2017-11-18 DIAGNOSIS — Z0000 Encounter for general adult medical examination without abnormal findings: Secondary | ICD-10-CM | POA: Diagnosis not present

## 2017-11-19 ENCOUNTER — Other Ambulatory Visit: Payer: Self-pay | Admitting: Internal Medicine

## 2017-11-19 DIAGNOSIS — Z1231 Encounter for screening mammogram for malignant neoplasm of breast: Secondary | ICD-10-CM

## 2017-12-24 ENCOUNTER — Ambulatory Visit
Admission: RE | Admit: 2017-12-24 | Discharge: 2017-12-24 | Disposition: A | Discharge status: Home / Self Care | Payer: 59 | Source: Ambulatory Visit | Attending: Internal Medicine | Admitting: Internal Medicine

## 2017-12-24 DIAGNOSIS — Z1231 Encounter for screening mammogram for malignant neoplasm of breast: Secondary | ICD-10-CM

## 2018-09-08 ENCOUNTER — Other Ambulatory Visit: Payer: Self-pay

## 2018-09-08 DIAGNOSIS — Z20822 Contact with and (suspected) exposure to covid-19: Secondary | ICD-10-CM

## 2018-09-09 LAB — NOVEL CORONAVIRUS, NAA: SARS-CoV-2, NAA: NOT DETECTED

## 2018-11-20 ENCOUNTER — Other Ambulatory Visit: Payer: Self-pay | Admitting: Internal Medicine

## 2018-11-20 DIAGNOSIS — Z1231 Encounter for screening mammogram for malignant neoplasm of breast: Secondary | ICD-10-CM

## 2018-11-25 ENCOUNTER — Other Ambulatory Visit: Payer: Self-pay

## 2018-11-25 DIAGNOSIS — Z20822 Contact with and (suspected) exposure to covid-19: Secondary | ICD-10-CM

## 2018-11-27 LAB — NOVEL CORONAVIRUS, NAA: SARS-CoV-2, NAA: DETECTED — AB

## 2018-12-26 ENCOUNTER — Ambulatory Visit
Admission: RE | Admit: 2018-12-26 | Discharge: 2018-12-26 | Disposition: A | Discharge status: Home / Self Care | Payer: BC Managed Care – PPO | Source: Ambulatory Visit | Attending: Internal Medicine | Admitting: Internal Medicine

## 2018-12-26 DIAGNOSIS — Z1231 Encounter for screening mammogram for malignant neoplasm of breast: Secondary | ICD-10-CM | POA: Diagnosis not present

## 2019-09-18 ENCOUNTER — Other Ambulatory Visit: Payer: Self-pay | Admitting: Internal Medicine

## 2019-09-18 ENCOUNTER — Other Ambulatory Visit (HOSPITAL_COMMUNITY): Payer: Self-pay | Admitting: Internal Medicine

## 2019-09-18 DIAGNOSIS — M5440 Lumbago with sciatica, unspecified side: Secondary | ICD-10-CM

## 2019-10-08 ENCOUNTER — Ambulatory Visit: Payer: BC Managed Care – PPO

## 2019-10-27 ENCOUNTER — Ambulatory Visit
Admission: RE | Admit: 2019-10-27 | Discharge: 2019-10-27 | Disposition: A | Discharge status: Home / Self Care | Payer: BC Managed Care – PPO | Source: Ambulatory Visit | Attending: Internal Medicine | Admitting: Internal Medicine

## 2019-10-27 ENCOUNTER — Other Ambulatory Visit: Payer: Self-pay

## 2019-10-27 DIAGNOSIS — M5440 Lumbago with sciatica, unspecified side: Secondary | ICD-10-CM

## 2019-11-02 ENCOUNTER — Other Ambulatory Visit: Payer: Self-pay | Admitting: Internal Medicine

## 2019-11-02 DIAGNOSIS — N281 Cyst of kidney, acquired: Secondary | ICD-10-CM

## 2019-11-05 ENCOUNTER — Ambulatory Visit
Admission: RE | Admit: 2019-11-05 | Discharge: 2019-11-05 | Disposition: A | Discharge status: Home / Self Care | Payer: BC Managed Care – PPO | Source: Ambulatory Visit | Attending: Internal Medicine | Admitting: Internal Medicine

## 2019-11-05 ENCOUNTER — Other Ambulatory Visit: Payer: Self-pay

## 2019-11-05 DIAGNOSIS — N281 Cyst of kidney, acquired: Secondary | ICD-10-CM | POA: Insufficient documentation

## 2019-11-12 ENCOUNTER — Other Ambulatory Visit: Payer: Self-pay | Admitting: Neurosurgery

## 2019-11-12 ENCOUNTER — Ambulatory Visit
Admission: RE | Admit: 2019-11-12 | Discharge: 2019-11-12 | Disposition: A | Discharge status: Home / Self Care | Payer: BC Managed Care – PPO | Source: Ambulatory Visit | Attending: Neurosurgery | Admitting: Neurosurgery

## 2019-11-12 ENCOUNTER — Ambulatory Visit
Admission: RE | Admit: 2019-11-12 | Discharge: 2019-11-12 | Disposition: A | Discharge status: Home / Self Care | Payer: BC Managed Care – PPO | Attending: Neurosurgery | Admitting: Neurosurgery

## 2019-11-12 DIAGNOSIS — M419 Scoliosis, unspecified: Secondary | ICD-10-CM | POA: Insufficient documentation

## 2020-01-11 ENCOUNTER — Other Ambulatory Visit: Payer: Self-pay | Admitting: Neurosurgery

## 2020-01-11 DIAGNOSIS — M419 Scoliosis, unspecified: Secondary | ICD-10-CM

## 2020-01-20 ENCOUNTER — Ambulatory Visit
Admission: RE | Admit: 2020-01-20 | Discharge: 2020-01-20 | Disposition: A | Discharge status: Home / Self Care | Payer: BC Managed Care – PPO | Source: Ambulatory Visit | Attending: Neurosurgery | Admitting: Neurosurgery

## 2020-01-20 ENCOUNTER — Other Ambulatory Visit: Payer: Self-pay

## 2020-01-20 DIAGNOSIS — M419 Scoliosis, unspecified: Secondary | ICD-10-CM | POA: Insufficient documentation

## 2020-09-26 ENCOUNTER — Other Ambulatory Visit: Payer: Self-pay | Admitting: Neurosurgery

## 2020-09-26 DIAGNOSIS — Z981 Arthrodesis status: Secondary | ICD-10-CM

## 2020-09-26 DIAGNOSIS — M419 Scoliosis, unspecified: Secondary | ICD-10-CM

## 2020-09-26 DIAGNOSIS — M4807 Spinal stenosis, lumbosacral region: Secondary | ICD-10-CM

## 2020-09-28 ENCOUNTER — Other Ambulatory Visit: Payer: Self-pay | Admitting: Neurosurgery

## 2020-09-28 DIAGNOSIS — M4807 Spinal stenosis, lumbosacral region: Secondary | ICD-10-CM

## 2020-09-28 DIAGNOSIS — Z981 Arthrodesis status: Secondary | ICD-10-CM

## 2020-09-28 DIAGNOSIS — M419 Scoliosis, unspecified: Secondary | ICD-10-CM

## 2020-10-07 ENCOUNTER — Ambulatory Visit: Payer: BC Managed Care – PPO

## 2020-10-14 ENCOUNTER — Other Ambulatory Visit: Payer: Self-pay

## 2020-10-14 ENCOUNTER — Ambulatory Visit
Admission: RE | Admit: 2020-10-14 | Discharge: 2020-10-14 | Disposition: A | Discharge status: Home / Self Care | Payer: BC Managed Care – PPO | Source: Ambulatory Visit | Attending: Neurosurgery | Admitting: Neurosurgery

## 2020-10-14 DIAGNOSIS — I700 Atherosclerosis of aorta: Secondary | ICD-10-CM | POA: Insufficient documentation

## 2020-10-14 DIAGNOSIS — M48061 Spinal stenosis, lumbar region without neurogenic claudication: Secondary | ICD-10-CM | POA: Insufficient documentation

## 2020-10-14 DIAGNOSIS — M4807 Spinal stenosis, lumbosacral region: Secondary | ICD-10-CM

## 2020-10-14 DIAGNOSIS — Z981 Arthrodesis status: Secondary | ICD-10-CM

## 2020-10-14 DIAGNOSIS — M419 Scoliosis, unspecified: Secondary | ICD-10-CM

## 2020-10-14 MED ORDER — IOHEXOL 300 MG/ML  SOLN
50.0000 mL | Freq: Once | INTRAMUSCULAR | Status: AC | PRN
  Administered 2020-10-14: 10 mL

## 2020-10-14 MED ORDER — ACETAMINOPHEN 325 MG PO TABS
650.0000 mg | ORAL_TABLET | Freq: Four times a day (QID) | ORAL | Status: DC | PRN
  Filled 2020-10-14: qty 2

## 2020-10-14 MED ORDER — SODIUM CHLORIDE (PF) 0.9 % IJ SOLN
10.0000 mL | INTRAMUSCULAR | Status: DC | PRN
  Administered 2020-10-14: 5 mL via INTRAVENOUS

## 2020-10-14 MED ORDER — LIDOCAINE HCL (PF) 1 % IJ SOLN
5.0000 mL | Freq: Once | INTRAMUSCULAR | Status: AC
  Administered 2020-10-14: 5 mL
  Filled 2020-10-14: qty 5

## 2021-03-17 ENCOUNTER — Other Ambulatory Visit: Payer: Self-pay | Admitting: Neurosurgery

## 2021-03-17 DIAGNOSIS — M960 Pseudarthrosis after fusion or arthrodesis: Secondary | ICD-10-CM

## 2021-03-17 DIAGNOSIS — M533 Sacrococcygeal disorders, not elsewhere classified: Secondary | ICD-10-CM

## 2021-03-17 DIAGNOSIS — Z981 Arthrodesis status: Secondary | ICD-10-CM

## 2021-03-28 ENCOUNTER — Ambulatory Visit
Admission: RE | Admit: 2021-03-28 | Discharge: 2021-03-28 | Disposition: A | Discharge status: Home / Self Care | Payer: BC Managed Care – PPO | Source: Ambulatory Visit | Attending: Neurosurgery | Admitting: Neurosurgery

## 2021-03-28 ENCOUNTER — Other Ambulatory Visit: Payer: Self-pay

## 2021-03-28 DIAGNOSIS — M533 Sacrococcygeal disorders, not elsewhere classified: Secondary | ICD-10-CM | POA: Diagnosis present

## 2021-03-28 DIAGNOSIS — M960 Pseudarthrosis after fusion or arthrodesis: Secondary | ICD-10-CM | POA: Diagnosis present

## 2021-03-28 DIAGNOSIS — Z981 Arthrodesis status: Secondary | ICD-10-CM | POA: Diagnosis present

## 2021-06-14 ENCOUNTER — Other Ambulatory Visit: Payer: Self-pay | Admitting: Neurosurgery

## 2021-06-14 DIAGNOSIS — Z01818 Encounter for other preprocedural examination: Secondary | ICD-10-CM

## 2021-06-26 ENCOUNTER — Encounter
Admission: RE | Admit: 2021-06-26 | Discharge: 2021-06-26 | Disposition: A | Discharge status: Home / Self Care | Payer: BC Managed Care – PPO | Source: Ambulatory Visit | Attending: Neurosurgery | Admitting: Neurosurgery

## 2021-06-26 VITALS — BP 136/91 | HR 74 | Resp 16 | Ht 65.0 in | Wt 161.3 lb

## 2021-06-26 DIAGNOSIS — R600 Localized edema: Secondary | ICD-10-CM

## 2021-06-26 DIAGNOSIS — R609 Edema, unspecified: Secondary | ICD-10-CM | POA: Diagnosis not present

## 2021-06-26 DIAGNOSIS — Z01818 Encounter for other preprocedural examination: Secondary | ICD-10-CM

## 2021-06-26 DIAGNOSIS — M960 Pseudarthrosis after fusion or arthrodesis: Secondary | ICD-10-CM

## 2021-06-26 DIAGNOSIS — Z01812 Encounter for preprocedural laboratory examination: Secondary | ICD-10-CM | POA: Diagnosis present

## 2021-06-26 DIAGNOSIS — D649 Anemia, unspecified: Secondary | ICD-10-CM

## 2021-06-26 HISTORY — DX: Motion sickness, initial encounter: T75.3XXA

## 2021-06-26 HISTORY — DX: Malignant (primary) neoplasm, unspecified: C80.1

## 2021-06-26 HISTORY — DX: Pseudarthrosis after fusion or arthrodesis: M96.0

## 2021-06-26 HISTORY — DX: Personal history of urinary calculi: Z87.442

## 2021-06-26 HISTORY — DX: Personal history of other specified conditions: Z87.898

## 2021-06-26 HISTORY — DX: Arthrodesis status: Z98.1

## 2021-06-26 HISTORY — DX: Migraine, unspecified, not intractable, without status migrainosus: G43.909

## 2021-06-26 LAB — TYPE AND SCREEN
ABO/RH(D): O POS
Antibody Screen: NEGATIVE

## 2021-06-26 LAB — BASIC METABOLIC PANEL
Anion gap: 9 (ref 5–15)
BUN: 19 mg/dL (ref 6–20)
CO2: 28 mmol/L (ref 22–32)
Calcium: 9.3 mg/dL (ref 8.9–10.3)
Chloride: 101 mmol/L (ref 98–111)
Creatinine, Ser: 0.69 mg/dL (ref 0.44–1.00)
GFR, Estimated: >60 mL/min (ref >60)
Glucose, Bld: 93 mg/dL (ref 70–99)
Potassium: 3.8 mmol/L (ref 3.5–5.1)
Sodium: 138 mmol/L (ref 135–145)

## 2021-06-26 LAB — URINALYSIS, ROUTINE W REFLEX MICROSCOPIC
Bilirubin Urine: NEGATIVE
Glucose, UA: NEGATIVE mg/dL
Hgb urine dipstick: NEGATIVE
Ketones, ur: NEGATIVE mg/dL
Leukocytes,Ua: NEGATIVE
Nitrite: NEGATIVE
Protein, ur: NEGATIVE mg/dL
Specific Gravity, Urine: 1.019 (ref 1.005–1.030)
pH: 5 (ref 5.0–8.0)

## 2021-06-26 LAB — CBC
HCT: 36.7 % (ref 36.0–46.0)
Hemoglobin: 12.2 g/dL (ref 12.0–15.0)
MCH: 31.1 pg (ref 26.0–34.0)
MCHC: 33.2 g/dL (ref 30.0–36.0)
MCV: 93.6 fL (ref 80.0–100.0)
Platelets: 338 10*3/uL (ref 150–400)
RBC: 3.92 MIL/uL (ref 3.87–5.11)
RDW: 13.2 % (ref 11.5–15.5)
WBC: 6.4 10*3/uL (ref 4.0–10.5)
nRBC: 0 % (ref 0.0–0.2)

## 2021-06-26 LAB — SURGICAL PCR SCREEN
MRSA, PCR: NEGATIVE
Staphylococcus aureus: NEGATIVE

## 2021-06-26 NOTE — Patient Instructions (Addendum)
Your procedure is scheduled on:07-05-21 Wednesday Report to the Registration Desk on the 1st floor of the [place].Then proceed to the 2nd floor Surgery Desk To find out your arrival time, please call [phone] between 1PM - 3PM on:07-04-21 Tuesday If your arrival time is 6:00 am, do not arrive prior to that time as the [place] entrance doors do not open until 6:00 am.  REMEMBER: Instructions that are not followed completely may result in serious medical risk, up to and including death; or upon the discretion of your surgeon and anesthesiologist your surgery may need to be rescheduled.  Do not eat food after midnight the night before surgery.  No gum chewing, lozengers or hard candies.  You may however, drink CLEAR liquids up to 2 hours before you are scheduled to arrive for your surgery. Do not drink anything within 2 hours of your scheduled arrival time.  Clear liquids include: - water  - apple juice without pulp - gatorade (not RED colors) - black coffee or tea (Do NOT add milk or creamers to the coffee or tea) Do NOT drink anything that is not on this list.  Do NOT take any medications the day of surgery  One week prior to surgery: Stop Anti-inflammatories (NSAIDS) such as Advil, Aleve, Ibuprofen, Motrin, Naproxen, Naprosyn and Aspirin based products such as Excedrin, Goodys Powder, BC Powder.You may however, take Tylenol/Tramadol if needed for pain up until the day of surgery. Stop ANY OVER THE COUNTER supplements/vitamins 7 days prior to surgery (Multivitamin)  No Alcohol for 24 hours before or after surgery.  No Smoking including e-cigarettes for 24 hours prior to surgery.  No chewable tobacco products for at least 6 hours prior to surgery.  No nicotine patches on the day of surgery.  Do not use any "recreational" drugs for at least a week prior to your surgery.  Please be advised that the combination of cocaine and anesthesia may have negative outcomes, up to and  including death. If you test positive for cocaine, your surgery will be cancelled.  On the morning of surgery brush your teeth with toothpaste and water, you may rinse your mouth with mouthwash if you wish. Do not swallow any toothpaste or mouthwash.  Use CHG Soap as directed on instruction sheet.  Do not wear jewelry, make-up, hairpins, clips or nail polish.  Do not wear lotions, powders, or perfumes.   Do not shave body from the neck down 48 hours prior to surgery just in case you cut yourself which could leave a site for infection.  Also, freshly shaved skin may become irritated if using the CHG soap.  Contact lenses, hearing aids and dentures may not be worn into surgery.  Do not bring valuables to the hospital. [facility] is not responsible for any missing/lost belongings or valuables.    Notify your doctor if there is any change in your medical condition (cold, fever, infection).  Wear comfortable clothing (specific to your surgery type) to the hospital.  After surgery, you can help prevent lung complications by doing breathing exercises.  Take deep breaths and cough every 1-2 hours. Your doctor may order a device called an Incentive Spirometer to help you take deep breaths. When coughing or sneezing, hold a pillow firmly against your incision with both hands. This is called "splinting." Doing this helps protect your incision. It also decreases belly discomfort.  If you are being admitted to the hospital overnight, leave your suitcase in the car. After surgery it may  be brought to your room.  If you are being discharged the day of surgery, you will not be allowed to drive home. You will need a responsible adult (18 years or older) to drive you home and stay with you that night.   If you are taking public transportation, you will need to have a responsible adult (18 years or older) with you. Please confirm with your physician that it is acceptable to use public transportation.    Please call the [place] Dept. at [phone] if you have any questions about these instructions.  Surgery Visitation Policy:  Patients undergoing a surgery or procedure may have two family members or support persons with them as long as the person is not COVID-19 positive or experiencing its symptoms.   Inpatient Visitation:    Visiting hours are 7 a.m. to 8 p.m. Up to four visitors are allowed at one time in a patient room, including children. The visitors may rotate out with other people during the day. One designated support person (adult) may remain overnight.

## 2021-07-04 MED ORDER — FAMOTIDINE 20 MG PO TABS: 20.0000 mg | ORAL_TABLET | Freq: Once | ORAL | Status: AC

## 2021-07-04 MED ORDER — CHLORHEXIDINE GLUCONATE 0.12 % MT SOLN: 15.0000 mL | Freq: Once | OROMUCOSAL | Status: AC

## 2021-07-04 MED ORDER — LACTATED RINGERS IV SOLN: INTRAVENOUS | Status: DC

## 2021-07-04 MED ORDER — ORAL CARE MOUTH RINSE: 15.0000 mL | Freq: Once | OROMUCOSAL | Status: AC

## 2021-07-04 MED ORDER — VANCOMYCIN HCL 1250 MG/250ML IV SOLN
1250.0000 mg | INTRAVENOUS | Status: AC
  Administered 2021-07-05: 1250 mg via INTRAVENOUS
  Filled 2021-07-04: qty 250

## 2021-07-04 MED ORDER — CEFAZOLIN SODIUM-DEXTROSE 2-4 GM/100ML-% IV SOLN
2.0000 g | INTRAVENOUS | Status: AC
  Administered 2021-07-05: 2 g via INTRAVENOUS

## 2021-07-04 NOTE — Progress Notes (Signed)
Pharmacy Antibiotic Note  Kristin Thomas is a 56 y.o. female admitted on (Not on file) with surgical prophylaxis.  Pharmacy has been consulted for Cefazolin , Vancomycin dosing.  Plan: TBW = 73.2 kg   Cefazolin 2 gm IV 60 min pre-op ordered for 6/21 @ 0500.  Vancomycin 1250 mg (15 mg/kg) IV 60 min pre-op ordered for 6/21 @ 0500.      No data recorded.  No results for input(s): "WBC", "CREATININE", "LATICACIDVEN", "VANCOTROUGH", "VANCOPEAK", "VANCORANDOM", "GENTTROUGH", "GENTPEAK", "GENTRANDOM", "TOBRATROUGH", "TOBRAPEAK", "TOBRARND", "AMIKACINPEAK", "AMIKACINTROU", "AMIKACIN" in the last 168 hours.  Estimated Creatinine Clearance: 78.7 mL/min (by C-G formula based on SCr of 0.69 mg/dL).    No Known Allergies  Antimicrobials this admission:   >>    >>   Dose adjustments this admission:   Microbiology results:  BCx:   UCx:    Sputum:    MRSA PCR:   Thank you for allowing pharmacy to be a part of this patient's care.  Manaia Samad D 07/04/2021 10:59 PM

## 2021-07-05 ENCOUNTER — Encounter
Admission: RE | Disposition: A | Discharge status: Home / Self Care | Payer: Self-pay | Source: Home / Self Care | Attending: Neurosurgery

## 2021-07-05 ENCOUNTER — Inpatient Hospital Stay
Admission: RE | Admit: 2021-07-05 | Discharge: 2021-07-07 | DRG: 460 | Disposition: A | Discharge status: Home / Self Care | Payer: BC Managed Care – PPO | Attending: Neurosurgery | Admitting: Neurosurgery

## 2021-07-05 ENCOUNTER — Other Ambulatory Visit: Payer: Self-pay

## 2021-07-05 ENCOUNTER — Inpatient Hospital Stay: Payer: BC Managed Care – PPO | Admitting: Urgent Care

## 2021-07-05 ENCOUNTER — Inpatient Hospital Stay: Payer: BC Managed Care – PPO

## 2021-07-05 ENCOUNTER — Inpatient Hospital Stay: Payer: BC Managed Care – PPO | Admitting: Anesthesiology

## 2021-07-05 ENCOUNTER — Encounter: Payer: Self-pay | Admitting: Neurosurgery

## 2021-07-05 DIAGNOSIS — M960 Pseudarthrosis after fusion or arthrodesis: Principal | ICD-10-CM | POA: Diagnosis present

## 2021-07-05 DIAGNOSIS — Z79899 Other long term (current) drug therapy: Secondary | ICD-10-CM | POA: Diagnosis not present

## 2021-07-05 DIAGNOSIS — Z79890 Hormone replacement therapy (postmenopausal): Secondary | ICD-10-CM

## 2021-07-05 DIAGNOSIS — Y838 Other surgical procedures as the cause of abnormal reaction of the patient, or of later complication, without mention of misadventure at the time of the procedure: Secondary | ICD-10-CM | POA: Diagnosis present

## 2021-07-05 DIAGNOSIS — I700 Atherosclerosis of aorta: Secondary | ICD-10-CM | POA: Diagnosis present

## 2021-07-05 DIAGNOSIS — Z981 Arthrodesis status: Principal | ICD-10-CM

## 2021-07-05 HISTORY — PX: APPLICATION OF INTRAOPERATIVE CT SCAN: SHX6668

## 2021-07-05 HISTORY — PX: LUMBAR FUSION: SHX111

## 2021-07-05 HISTORY — PX: HARDWARE REMOVAL: SHX979

## 2021-07-05 LAB — ABO/RH: ABO/RH(D): O POS

## 2021-07-05 SURGERY — REMOVAL, HARDWARE
Anesthesia: General | Site: Spine Lumbar

## 2021-07-05 MED ORDER — FENTANYL CITRATE PF 50 MCG/ML IJ SOSY: 50.0000 ug | PREFILLED_SYRINGE | Freq: Once | INTRAMUSCULAR | Status: DC

## 2021-07-05 MED ORDER — BISACODYL 10 MG RE SUPP: 10.0000 mg | Freq: Every day | RECTAL | Status: DC | PRN

## 2021-07-05 MED ORDER — REMIFENTANIL HCL 1 MG IV SOLR
INTRAVENOUS | Status: AC
  Filled 2021-07-05: qty 1000

## 2021-07-05 MED ORDER — SODIUM CHLORIDE 0.9 % IV SOLN: 250.0000 mL | INTRAVENOUS | Status: DC

## 2021-07-05 MED ORDER — PHENYLEPHRINE 80 MCG/ML (10ML) SYRINGE FOR IV PUSH (FOR BLOOD PRESSURE SUPPORT)
PREFILLED_SYRINGE | INTRAVENOUS | Status: AC
  Filled 2021-07-05: qty 10

## 2021-07-05 MED ORDER — SODIUM CHLORIDE FLUSH 0.9 % IV SOLN
INTRAVENOUS | Status: AC
  Filled 2021-07-05: qty 20

## 2021-07-05 MED ORDER — SUGAMMADEX SODIUM 200 MG/2ML IV SOLN
INTRAVENOUS | Status: DC | PRN
  Administered 2021-07-05: 100 mg via INTRAVENOUS

## 2021-07-05 MED ORDER — PHENOL 1.4 % MT LIQD: 1.0000 | OROMUCOSAL | Status: DC | PRN

## 2021-07-05 MED ORDER — OXYCODONE HCL 5 MG PO TABS
10.0000 mg | ORAL_TABLET | ORAL | Status: DC | PRN
  Administered 2021-07-05 (×2): 10 mg via ORAL
  Filled 2021-07-05 (×2): qty 2

## 2021-07-05 MED ORDER — METHOCARBAMOL 1000 MG/10ML IJ SOLN
500.0000 mg | Freq: Four times a day (QID) | INTRAVENOUS | Status: DC
  Filled 2021-07-05: qty 5

## 2021-07-05 MED ORDER — HYDROMORPHONE HCL 1 MG/ML IJ SOLN
0.5000 mg | INTRAMUSCULAR | Status: AC | PRN
  Administered 2021-07-05 (×2): 0.5 mg via INTRAVENOUS

## 2021-07-05 MED ORDER — PROPOFOL 10 MG/ML IV BOLUS
INTRAVENOUS | Status: AC
  Filled 2021-07-05: qty 20

## 2021-07-05 MED ORDER — MIDAZOLAM HCL 2 MG/2ML IJ SOLN
INTRAMUSCULAR | Status: AC
  Filled 2021-07-05: qty 2

## 2021-07-05 MED ORDER — CHLORHEXIDINE GLUCONATE 0.12 % MT SOLN
OROMUCOSAL | Status: AC
  Administered 2021-07-05: 15 mL via OROMUCOSAL
  Filled 2021-07-05: qty 15

## 2021-07-05 MED ORDER — SENNA 8.6 MG PO TABS
1.0000 | ORAL_TABLET | Freq: Two times a day (BID) | ORAL | Status: DC
  Administered 2021-07-05 – 2021-07-07 (×4): 8.6 mg via ORAL
  Filled 2021-07-05 (×4): qty 1

## 2021-07-05 MED ORDER — LIDOCAINE HCL (CARDIAC) PF 100 MG/5ML IV SOSY: PREFILLED_SYRINGE | INTRAVENOUS | Status: DC | PRN

## 2021-07-05 MED ORDER — DOCUSATE SODIUM 100 MG PO CAPS
100.0000 mg | ORAL_CAPSULE | Freq: Two times a day (BID) | ORAL | Status: DC
  Administered 2021-07-05 – 2021-07-07 (×4): 100 mg via ORAL
  Filled 2021-07-05 (×4): qty 1

## 2021-07-05 MED ORDER — BUPIVACAINE HCL (PF) 0.5 % IJ SOLN
INTRAMUSCULAR | Status: AC
  Filled 2021-07-05: qty 30

## 2021-07-05 MED ORDER — SUCCINYLCHOLINE CHLORIDE 200 MG/10ML IV SOSY
PREFILLED_SYRINGE | INTRAVENOUS | Status: DC | PRN
  Administered 2021-07-05: 100 mg via INTRAVENOUS

## 2021-07-05 MED ORDER — FENTANYL CITRATE (PF) 100 MCG/2ML IJ SOLN
INTRAMUSCULAR | Status: AC
  Administered 2021-07-05: 50 ug via INTRAVENOUS
  Filled 2021-07-05: qty 2

## 2021-07-05 MED ORDER — SODIUM CHLORIDE (PF) 0.9 % IJ SOLN
INTRAMUSCULAR | Status: DC | PRN
  Administered 2021-07-05: 60 mL via INTRAMUSCULAR

## 2021-07-05 MED ORDER — PROMETHAZINE HCL 25 MG/ML IJ SOLN: 6.2500 mg | INTRAMUSCULAR | Status: DC | PRN

## 2021-07-05 MED ORDER — HYDROMORPHONE HCL 1 MG/ML IJ SOLN
INTRAMUSCULAR | Status: AC
  Administered 2021-07-05: 0.5 mg via INTRAVENOUS
  Filled 2021-07-05: qty 1

## 2021-07-05 MED ORDER — DEXAMETHASONE SODIUM PHOSPHATE 10 MG/ML IJ SOLN
INTRAMUSCULAR | Status: DC | PRN
  Administered 2021-07-05: 10 mg via INTRAVENOUS

## 2021-07-05 MED ORDER — CEFAZOLIN SODIUM-DEXTROSE 2-4 GM/100ML-% IV SOLN
INTRAVENOUS | Status: AC
  Filled 2021-07-05: qty 100

## 2021-07-05 MED ORDER — FAMOTIDINE 20 MG PO TABS
ORAL_TABLET | ORAL | Status: AC
  Administered 2021-07-05: 20 mg via ORAL
  Filled 2021-07-05: qty 1

## 2021-07-05 MED ORDER — PROMETHAZINE HCL 25 MG/ML IJ SOLN
INTRAMUSCULAR | Status: AC
  Administered 2021-07-05: 12.5 mg via INTRAVENOUS
  Filled 2021-07-05: qty 1

## 2021-07-05 MED ORDER — ACETAMINOPHEN 10 MG/ML IV SOLN
INTRAVENOUS | Status: DC | PRN
  Administered 2021-07-05: 1000 mg via INTRAVENOUS

## 2021-07-05 MED ORDER — ALPRAZOLAM 0.25 MG PO TABS
0.2500 mg | ORAL_TABLET | Freq: Every evening | ORAL | Status: DC | PRN
Start: 2021-07-05 — End: 2021-07-07
  Administered 2021-07-06: 0.25 mg via ORAL
  Filled 2021-07-05: qty 1

## 2021-07-05 MED ORDER — SEVOFLURANE IN SOLN
RESPIRATORY_TRACT | Status: AC
  Filled 2021-07-05: qty 250

## 2021-07-05 MED ORDER — REMIFENTANIL HCL 1 MG IV SOLR
INTRAVENOUS | Status: DC | PRN
  Administered 2021-07-05: 100 ug via INTRAVENOUS
  Administered 2021-07-05: .1 ug/kg/min via INTRAVENOUS

## 2021-07-05 MED ORDER — ROCURONIUM BROMIDE 100 MG/10ML IV SOLN
INTRAVENOUS | Status: DC | PRN
  Administered 2021-07-05: 10 mg via INTRAVENOUS

## 2021-07-05 MED ORDER — MIDAZOLAM HCL 2 MG/2ML IJ SOLN
INTRAMUSCULAR | Status: DC | PRN
  Administered 2021-07-05: 2 mg via INTRAVENOUS

## 2021-07-05 MED ORDER — SURGIPHOR WOUND IRRIGATION SYSTEM - OPTIME
TOPICAL | Status: DC | PRN
  Administered 2021-07-05: 450 mL

## 2021-07-05 MED ORDER — LIDOCAINE HCL (CARDIAC) PF 100 MG/5ML IV SOSY
PREFILLED_SYRINGE | INTRAVENOUS | Status: DC | PRN
  Administered 2021-07-05: 60 mg via INTRAVENOUS

## 2021-07-05 MED ORDER — SODIUM CHLORIDE 0.9% FLUSH
3.0000 mL | Freq: Two times a day (BID) | INTRAVENOUS | Status: DC
  Administered 2021-07-05 – 2021-07-06 (×3): 3 mL via INTRAVENOUS

## 2021-07-05 MED ORDER — ONDANSETRON HCL 4 MG PO TABS: 4.0000 mg | ORAL_TABLET | Freq: Four times a day (QID) | ORAL | Status: DC | PRN

## 2021-07-05 MED ORDER — METHOCARBAMOL 500 MG PO TABS
500.0000 mg | ORAL_TABLET | Freq: Four times a day (QID) | ORAL | Status: DC
  Administered 2021-07-05 – 2021-07-07 (×9): 500 mg via ORAL
  Filled 2021-07-05 (×9): qty 1

## 2021-07-05 MED ORDER — BUPIVACAINE LIPOSOME 1.3 % IJ SUSP
INTRAMUSCULAR | Status: AC
  Filled 2021-07-05: qty 20

## 2021-07-05 MED ORDER — FENTANYL CITRATE (PF) 100 MCG/2ML IJ SOLN
INTRAMUSCULAR | Status: DC | PRN
  Administered 2021-07-05 (×2): 50 ug via INTRAVENOUS

## 2021-07-05 MED ORDER — SODIUM CHLORIDE FLUSH 0.9 % IV SOLN
INTRAVENOUS | Status: AC
  Filled 2021-07-05: qty 10

## 2021-07-05 MED ORDER — MORPHINE SULFATE (PF) 2 MG/ML IV SOLN: 2.0000 mg | INTRAVENOUS | Status: AC | PRN

## 2021-07-05 MED ORDER — PROPOFOL 1000 MG/100ML IV EMUL
INTRAVENOUS | Status: AC
  Filled 2021-07-05: qty 100

## 2021-07-05 MED ORDER — ENOXAPARIN SODIUM 40 MG/0.4ML IJ SOSY
40.0000 mg | PREFILLED_SYRINGE | INTRAMUSCULAR | Status: DC
  Administered 2021-07-06 – 2021-07-07 (×2): 40 mg via SUBCUTANEOUS
  Filled 2021-07-05 (×2): qty 0.4

## 2021-07-05 MED ORDER — ONDANSETRON HCL 4 MG/2ML IJ SOLN
4.0000 mg | Freq: Four times a day (QID) | INTRAMUSCULAR | Status: DC | PRN
  Administered 2021-07-07: 4 mg via INTRAVENOUS
  Filled 2021-07-05: qty 2

## 2021-07-05 MED ORDER — ACETAMINOPHEN 10 MG/ML IV SOLN
INTRAVENOUS | Status: AC
  Filled 2021-07-05: qty 100

## 2021-07-05 MED ORDER — FENTANYL CITRATE (PF) 100 MCG/2ML IJ SOLN
INTRAMUSCULAR | Status: AC
  Administered 2021-07-05: 25 ug via INTRAVENOUS
  Filled 2021-07-05: qty 2

## 2021-07-05 MED ORDER — PHENYLEPHRINE HCL-NACL 20-0.9 MG/250ML-% IV SOLN
INTRAVENOUS | Status: AC
  Filled 2021-07-05: qty 250

## 2021-07-05 MED ORDER — VANCOMYCIN HCL 1000 MG IV SOLR
INTRAVENOUS | Status: AC
  Filled 2021-07-05: qty 20

## 2021-07-05 MED ORDER — OXYCODONE HCL 5 MG PO TABS
5.0000 mg | ORAL_TABLET | ORAL | Status: DC | PRN
  Administered 2021-07-06 (×2): 5 mg via ORAL
  Filled 2021-07-05 (×2): qty 1

## 2021-07-05 MED ORDER — ZOLPIDEM TARTRATE 5 MG PO TABS
5.0000 mg | ORAL_TABLET | Freq: Every day | ORAL | Status: DC
  Administered 2021-07-05 – 2021-07-06 (×2): 5 mg via ORAL
  Filled 2021-07-05 (×2): qty 1

## 2021-07-05 MED ORDER — ACETAMINOPHEN 500 MG PO TABS
1000.0000 mg | ORAL_TABLET | Freq: Four times a day (QID) | ORAL | Status: AC
  Administered 2021-07-05 – 2021-07-06 (×3): 1000 mg via ORAL
  Filled 2021-07-05 (×3): qty 2

## 2021-07-05 MED ORDER — PROPOFOL 500 MG/50ML IV EMUL
INTRAVENOUS | Status: DC | PRN
  Administered 2021-07-05: 150 ug/kg/min via INTRAVENOUS

## 2021-07-05 MED ORDER — POLYETHYLENE GLYCOL 3350 17 G PO PACK: 17.0000 g | PACK | Freq: Every day | ORAL | Status: DC | PRN

## 2021-07-05 MED ORDER — HYDROCHLOROTHIAZIDE 12.5 MG PO TABS
12.5000 mg | ORAL_TABLET | Freq: Every day | ORAL | Status: DC
  Administered 2021-07-06 – 2021-07-07 (×2): 12.5 mg via ORAL
  Filled 2021-07-05 (×2): qty 1

## 2021-07-05 MED ORDER — ONDANSETRON HCL 4 MG/2ML IJ SOLN
INTRAMUSCULAR | Status: DC | PRN
  Administered 2021-07-05: 4 mg via INTRAVENOUS

## 2021-07-05 MED ORDER — FLEET ENEMA 7-19 GM/118ML RE ENEM: 1.0000 | ENEMA | Freq: Once | RECTAL | Status: DC | PRN

## 2021-07-05 MED ORDER — VANCOMYCIN HCL 1000 MG IV SOLR
INTRAVENOUS | Status: DC | PRN
  Administered 2021-07-05: 1000 mg via TOPICAL

## 2021-07-05 MED ORDER — BUPIVACAINE-EPINEPHRINE (PF) 0.5% -1:200000 IJ SOLN
INTRAMUSCULAR | Status: DC | PRN
  Administered 2021-07-05: 8 mL

## 2021-07-05 MED ORDER — KETOROLAC TROMETHAMINE 15 MG/ML IJ SOLN
15.0000 mg | Freq: Four times a day (QID) | INTRAMUSCULAR | Status: AC
  Administered 2021-07-05 – 2021-07-06 (×4): 15 mg via INTRAVENOUS
  Filled 2021-07-05 (×4): qty 1

## 2021-07-05 MED ORDER — SODIUM CHLORIDE 0.9 % IV SOLN: INTRAVENOUS | Status: DC

## 2021-07-05 MED ORDER — BUPIVACAINE-EPINEPHRINE (PF) 0.5% -1:200000 IJ SOLN
INTRAMUSCULAR | Status: AC
  Filled 2021-07-05: qty 30

## 2021-07-05 MED ORDER — PHENYLEPHRINE HCL-NACL 20-0.9 MG/250ML-% IV SOLN
INTRAVENOUS | Status: DC | PRN
  Administered 2021-07-05: 80 ug via INTRAVENOUS
  Administered 2021-07-05: 160 ug via INTRAVENOUS
  Administered 2021-07-05 (×2): 80 ug via INTRAVENOUS
  Administered 2021-07-05: 30 ug/min via INTRAVENOUS

## 2021-07-05 MED ORDER — SURGIFLO WITH THROMBIN (HEMOSTATIC MATRIX KIT) OPTIME
TOPICAL | Status: DC | PRN
  Administered 2021-07-05: 1 via TOPICAL

## 2021-07-05 MED ORDER — STERILE WATER FOR IRRIGATION IR SOLN
Status: DC | PRN
  Administered 2021-07-05: 1000 mL

## 2021-07-05 MED ORDER — MENTHOL 3 MG MT LOZG: 1.0000 | LOZENGE | OROMUCOSAL | Status: DC | PRN

## 2021-07-05 MED ORDER — ESTRADIOL 1 MG PO TABS
1.0000 mg | ORAL_TABLET | ORAL | Status: DC
  Administered 2021-07-05 – 2021-07-07 (×3): 1 mg via ORAL
  Filled 2021-07-05 (×3): qty 1

## 2021-07-05 MED ORDER — PROPOFOL 10 MG/ML IV BOLUS
INTRAVENOUS | Status: DC | PRN
  Administered 2021-07-05: 20 mg via INTRAVENOUS
  Administered 2021-07-05: 50 mg via INTRAVENOUS
  Administered 2021-07-05: 30 mg via INTRAVENOUS
  Administered 2021-07-05 (×2): 50 mg via INTRAVENOUS

## 2021-07-05 MED ORDER — 0.9 % SODIUM CHLORIDE (POUR BTL) OPTIME
TOPICAL | Status: DC | PRN
  Administered 2021-07-05: 1000 mL
  Administered 2021-07-05: 500 mL

## 2021-07-05 MED ORDER — FENTANYL CITRATE (PF) 100 MCG/2ML IJ SOLN
INTRAMUSCULAR | Status: AC
  Filled 2021-07-05: qty 2

## 2021-07-05 MED ORDER — FENTANYL CITRATE (PF) 100 MCG/2ML IJ SOLN
25.0000 ug | INTRAMUSCULAR | Status: DC | PRN
  Administered 2021-07-05: 50 ug via INTRAVENOUS
  Administered 2021-07-05: 25 ug via INTRAVENOUS
  Administered 2021-07-05: 50 ug via INTRAVENOUS

## 2021-07-05 MED ORDER — SODIUM CHLORIDE 0.9% FLUSH: 3.0000 mL | INTRAVENOUS | Status: DC | PRN

## 2021-07-05 SURGICAL SUPPLY — 79 items
ADH SKN CLS APL DERMABOND .7 (GAUZE/BANDAGES/DRESSINGS) ×1
AGENT HMST KT MTR STRL THRMB (HEMOSTASIS) ×1
ALLOGRAFT BONE FIBER KORE 10CC (Bone Implant) ×1 IMPLANT
BASIN KIT SINGLE STR (MISCELLANEOUS) ×2 IMPLANT
BUR NEURO DRILL SOFT 3.0X3.8M (BURR) ×2 IMPLANT
CAP LOCKING THREADED (Cap) ×15 IMPLANT
CNTNR SPEC 2.5X3XGRAD LEK (MISCELLANEOUS) ×1
CONN THRD HO CREO 20 LG (Connector) ×2 IMPLANT
CONNECTOR HEAD TO 5.5-6.35 12 (Connector) ×1 IMPLANT
CONNECTOR THRD HO CREO 20 LG (Connector) IMPLANT
CONT SPEC 4OZ STER OR WHT (MISCELLANEOUS) ×1
CONT SPEC 4OZ STRL OR WHT (MISCELLANEOUS) ×1
CONTAINER SPEC 2.5X3XGRAD LEK (MISCELLANEOUS) ×1 IMPLANT
COUNTER NEEDLE 20/40 LG (NEEDLE) ×2 IMPLANT
DERMABOND ADVANCED (GAUZE/BANDAGES/DRESSINGS) ×1
DERMABOND ADVANCED .7 DNX12 (GAUZE/BANDAGES/DRESSINGS) ×1 IMPLANT
DRAPE 3D C-ARM OEC (DRAPES) IMPLANT
DRAPE C ARM PK CFD 31 SPINE (DRAPES) ×2 IMPLANT
DRAPE C-ARMOR (DRAPES) IMPLANT
DRAPE LAPAROTOMY 100X77 ABD (DRAPES) ×2 IMPLANT
DRAPE MICROSCOPE SPINE 48X150 (DRAPES) ×2 IMPLANT
DRAPE SCAN PATIENT (DRAPES) ×2 IMPLANT
DRAPE SURG 17X11 SM STRL (DRAPES) ×2 IMPLANT
DRESSING PREVENA PLUS CUSTOM (GAUZE/BANDAGES/DRESSINGS) IMPLANT
DRSG PREVENA PLUS CUSTOM (GAUZE/BANDAGES/DRESSINGS) ×2
DRSG TEGADERM 4X4.75 (GAUZE/BANDAGES/DRESSINGS) ×1 IMPLANT
ELECT CAUTERY BLADE TIP 2.5 (TIP) ×2
ELECT EZSTD 165MM 6.5IN (MISCELLANEOUS)
ELECT REM PT RETURN 9FT ADLT (ELECTROSURGICAL) ×2
ELECTRODE CAUTERY BLDE TIP 2.5 (TIP) ×1 IMPLANT
ELECTRODE EZSTD 165MM 6.5IN (MISCELLANEOUS) IMPLANT
ELECTRODE REM PT RTRN 9FT ADLT (ELECTROSURGICAL) ×1 IMPLANT
GAUZE 4X4 16PLY ~~LOC~~+RFID DBL (SPONGE) ×2 IMPLANT
GLOVE BIOGEL PI IND STRL 6.5 (GLOVE) ×1 IMPLANT
GLOVE BIOGEL PI IND STRL 8.5 (GLOVE) ×1 IMPLANT
GLOVE BIOGEL PI INDICATOR 6.5 (GLOVE) ×1
GLOVE BIOGEL PI INDICATOR 8.5 (GLOVE) ×1
GLOVE SURG SYN 6.5 ES PF (GLOVE) ×4 IMPLANT
GLOVE SURG SYN 6.5 PF PI (GLOVE) ×2 IMPLANT
GLOVE SURG SYN 8.5  E (GLOVE) ×6
GLOVE SURG SYN 8.5 E (GLOVE) ×3 IMPLANT
GLOVE SURG SYN 8.5 PF PI (GLOVE) ×3 IMPLANT
GOWN SRG LRG LVL 4 IMPRV REINF (GOWNS) ×1 IMPLANT
GOWN SRG XL LVL 3 NONREINFORCE (GOWNS) ×1 IMPLANT
GOWN STRL NON-REIN TWL XL LVL3 (GOWNS) ×2
GOWN STRL REIN LRG LVL4 (GOWNS) ×2
GRADUATE 1200CC STRL 31836 (MISCELLANEOUS) ×2 IMPLANT
GRAFT DURAGEN MATRIX 1WX1L (Tissue) IMPLANT
KIT INFUSE MEDIUM (Orthopedic Implant) ×1 IMPLANT
KIT SPINAL PRONEVIEW (KITS) ×2 IMPLANT
MANIFOLD NEPTUNE II (INSTRUMENTS) ×2 IMPLANT
MARKER SKIN DUAL TIP RULER LAB (MISCELLANEOUS) ×3 IMPLANT
MARKER SPHERE PSV REFLC 13MM (MARKER) ×14 IMPLANT
NDL SAFETY ECLIPSE 18X1.5 (NEEDLE) ×1 IMPLANT
NEEDLE HYPO 18GX1.5 SHARP (NEEDLE) ×2
NEEDLE HYPO 22GX1.5 SAFETY (NEEDLE) ×2 IMPLANT
NS IRRIG 500ML POUR BTL (IV SOLUTION) ×1 IMPLANT
PACK LAMINECTOMY NEURO (CUSTOM PROCEDURE TRAY) ×2 IMPLANT
PAD ARMBOARD 7.5X6 YLW CONV (MISCELLANEOUS) ×2 IMPLANT
PREVENA INCISION MGT 90 150 (MISCELLANEOUS) ×1 IMPLANT
ROD CREO 125MM SPINAL (Rod) ×1 IMPLANT
ROD SPINE CVD CREO 5.5X150 (Rod) ×1 IMPLANT
SCREW CREO PA 9.5X40 (Screw) ×1 IMPLANT
SCREW SPINE CREO COCR 8.5X40 (Screw) ×1 IMPLANT
SCREW SPINE CREO COCR 8.5X90 (Screw) ×2 IMPLANT
SOLUTION IRRIG SURGIPHOR (IV SOLUTION) ×2 IMPLANT
SURGIFLO W/THROMBIN 8M KIT (HEMOSTASIS) ×2 IMPLANT
SUT DVC VLOC 3-0 CL 6 P-12 (SUTURE) ×2 IMPLANT
SUT V-LOC 90 ABS DVC 3-0 CL (SUTURE) ×2 IMPLANT
SUT VIC AB 0 CT1 18XCR BRD 8 (SUTURE) IMPLANT
SUT VIC AB 0 CT1 27 (SUTURE) ×4
SUT VIC AB 0 CT1 27XCR 8 STRN (SUTURE) ×2 IMPLANT
SUT VIC AB 0 CT1 8-18 (SUTURE) ×4
SUT VIC AB 2-0 CT1 18 (SUTURE) ×5 IMPLANT
SYR 10ML LL (SYRINGE) ×2 IMPLANT
SYR 20ML LL LF (SYRINGE) ×2 IMPLANT
SYR 30ML LL (SYRINGE) ×4 IMPLANT
TOWEL OR 17X26 4PK STRL BLUE (TOWEL DISPOSABLE) ×6 IMPLANT
TUBING CONNECTING 10 (TUBING) ×2 IMPLANT

## 2021-07-05 NOTE — OR Nursing (Signed)
Hardware removed during procedure includes 4 screws, 2 rods, and 8 set screws. Unable to document explant in system due to hardware being implanted at another  hospital system.

## 2021-07-05 NOTE — Anesthesia Preprocedure Evaluation (Signed)
Anesthesia Evaluation  Patient identified by MRN, date of birth, ID band Patient awake    Reviewed: Allergy & Precautions, H&P , NPO status , Patient's Chart, lab work & pertinent test results, reviewed documented beta blocker date and time   History of Anesthesia Complications (+) PONV and history of anesthetic complications  Airway Mallampati: I  TM Distance: >3 FB Neck ROM: full    Dental  (+) Dental Advidsory Given, Caps, Teeth Intact   Pulmonary neg pulmonary ROS,    Pulmonary exam normal breath sounds clear to auscultation       Cardiovascular Exercise Tolerance: Good negative cardio ROS Normal cardiovascular exam Rhythm:regular Rate:Normal     Neuro/Psych neg Seizures PSYCHIATRIC DISORDERS Anxiety  Neuromuscular disease    GI/Hepatic Neg liver ROS, GERD  ,  Endo/Other  negative endocrine ROS  Renal/GU negative Renal ROS  negative genitourinary   Musculoskeletal   Abdominal   Peds  Hematology negative hematology ROS (+)   Anesthesia Other Findings Past Medical History: No date: Anemia No date: Anxiety No date: Arthritis No date: Cancer ([place])     Comment:  basal cell skin cancer No date: Complication of anesthesia No date: GERD (gastroesophageal reflux disease) No date: History of kidney stones No date: History of peripheral edema No date: Hyperlipidemia No date: Lumbar nerve root impingement     Comment:  L1 No date: Migraines No date: Motion sickness No date: PONV (postoperative nausea and vomiting) No date: Pseudarthrosis following spinal fusion No date: S/P lumbar spinal fusion   Reproductive/Obstetrics negative OB ROS                             Anesthesia Physical Anesthesia Plan  ASA: 2  Anesthesia Plan: General   Post-op Pain Management:    Induction: Intravenous  PONV Risk Score and Plan: 4 or greater and Propofol infusion, TIVA, Midazolam, Ondansetron  and Dexamethasone  Airway Management Planned: Oral ETT  Additional Equipment:   Intra-op Plan:   Post-operative Plan: Extubation in OR  Informed Consent: I have reviewed the patients History and Physical, chart, labs and discussed the procedure including the risks, benefits and alternatives for the proposed anesthesia with the patient or authorized representative who has indicated his/her understanding and acceptance.     Dental Advisory Given  Plan Discussed with: Anesthesiologist, CRNA and Surgeon  Anesthesia Plan Comments:         Anesthesia Quick Evaluation

## 2021-07-05 NOTE — Anesthesia Procedure Notes (Addendum)
Procedure Name: Intubation Date/Time: 07/05/2021 7:33 AM  Performed by: Jonna Clark, CRNAPre-anesthesia Checklist: Patient identified, Patient being monitored, Timeout performed, Emergency Drugs available and Suction available Patient Re-evaluated:Patient Re-evaluated prior to induction Oxygen Delivery Method: Circle system utilized Preoxygenation: Pre-oxygenation with 100% oxygen Induction Type: IV induction Ventilation: Mask ventilation without difficulty Laryngoscope Size: Mac and 3 Grade View: Grade I Tube type: Oral Tube size: 7.0 mm Number of attempts: 1 Airway Equipment and Method: Stylet Placement Confirmation: ETT inserted through vocal cords under direct vision, positive ETCO2 and breath sounds checked- equal and bilateral Secured at: 21 cm Tube secured with: Tape Dental Injury: Teeth and Oropharynx as per pre-operative assessment

## 2021-07-05 NOTE — Transfer of Care (Signed)
Immediate Anesthesia Transfer of Care Note  Patient: Kristin Thomas  Procedure(s) Performed: REMOVAL OF S2 SCREWS (Spine Lumbar) REVISION OF L5-S1 FUSION (Spine Lumbar) APPLICATION OF INTRAOPERATIVE CT SCAN (Spine Lumbar)  Patient Location: PACU  Anesthesia Type:General  Level of Consciousness: drowsy and patient cooperative  Airway & Oxygen Therapy: Patient Spontanous Breathing  Post-op Assessment: Report given to RN and Post -op Vital signs reviewed and stable  Post vital signs: Reviewed and stable  Last Vitals:  Vitals Value Taken Time  BP 142/88 07/05/21 1100  Temp    Pulse 81 07/05/21 1100  Resp 16 07/05/21 1100  SpO2 100 % 07/05/21 1100  Vitals shown include unvalidated device data.  Last Pain:  Vitals:   07/05/21 0613  TempSrc: Oral  PainSc: 4          Complications: No notable events documented.

## 2021-07-05 NOTE — H&P (Signed)
I have reviewed and confirmed my history and physical from 06/13/21 with no additions or changes. Plan for revision of L5-S1 fusion with removal of S2 AI screws.  Risks and benefits reviewed.  Heart sounds normal no MRG. Chest Clear to Auscultation Bilaterally.

## 2021-07-05 NOTE — Op Note (Signed)
Indications: Kristin Thomas is a 56 yo female who underwent extensive thoracolumbar fusion in the past and presented with a pseudoarthrosis at L5-S1.  She failed conservative management for treatment of this, and opted for surgical intervention.  She additionally had extensive pain around her orthopedic implant through the sacroiliac joint, warranting removal of her S2 AI screws  Pseudarthrosis following spinal fusion M96.0, T84.498A  Findings: pseudoarthrosis  Preoperative Diagnosis: Pseudarthrosis following spinal fusion M96.0, T84.498A Postoperative Diagnosis: same   EBL: 100 ml IVF: see AR ml Drains: 1 placed Disposition: Extubated and Stable to PACU Complications: none  A foley catheter was placed.   Preoperative Note:   Risks of surgery discussed include: infection, bleeding, stroke, coma, death, paralysis, CSF leak, nerve/spinal cord injury, numbness, tingling, weakness, complex regional pain syndrome, recurrent stenosis and/or disc herniation, vascular injury, development of instability, neck/back pain, need for further surgery, persistent symptoms, development of deformity, and the risks of anesthesia. The patient understood these risks and agreed to proceed.  Operative Note:  1. Removal of S2 alariliac screws 2. Posterolateral arthrodesis L5 to S1 3. Posterior segmental instrumentation L2 to S2 with removal and replacement of S1 screws 4. Use of stereotaxis    The patient was brought to the Operating Room, intubated and turned into the prone position. All pressure points were checked and double checked. The patient was prepped and draped in the standard fashion. A full timeout was performed. Preoperative antibiotics were given. The incision was injected with local anesthetic.  The incision was opened with a scalpel, then the soft tissues divided with the Bovie. Self-retaining retractors were placed. The paraspinus muscles were reflected laterally in subperiosteal fashion until  the transverse processes were visible.  The prior implants from L2 to the pelvis were identified and exposed.  The locking caps were loosened from L2 to the pelvis.  The S1 and S2 screws were removed bilaterally.  We then placed the stereotactic array in the lumbar spine and acquired stereotactic images.  After requiring stereotactic images, we used the stereotactic drill guide to cannulate the ilium on the left side and then on the right side and placed 8.5 x 80 mm screws on both sides.    With placement of these iliac screws, fixation was achieved to the pelvis.    After this was done, the rods were resecured and all implants were secured to the rods.  A confirmatory CT scan was taken to confirm placement of all implants.  At this point, the exposed surfaces were irrigated profusely and decorticated.  Allograft and bone morphogenic protein were placed at L5-S1 to aid in fusion.      A drain was placed subfascially.   After hemostasis, the wound was closed in layers with 0 and 2-0 vicryl. 3-0 monocryl and wound were applied to the incision.  The patient was then flipped supine and extubated with incident. All counts were correct times 2 at the end of the case. No immediate complications were noted.  Kristin Render PA assisted in the entire procedure.  Meade Maw MD

## 2021-07-06 NOTE — Evaluation (Signed)
Occupational Therapy Evaluation Patient Details Name: Kristin Thomas MRN: [id number] DOB: 1965/09/29 Today's Date: 07/06/2021   History of Present Illness Chaka Boyson is a 56yoF who comes to [facility] for elective lumbar spine surgery. Pt followed as an outpatient by neurosurgical for L5/S1 pseudoarthrosis. Pt taken to OR with Dr. Izora Ribas for removal of S2 alariliac screws, then posterior athrodesis  L5/S1, hardware revision L2/S2.   Clinical Impression   Pt seen for OT evaluation this date, POD#1 from lumbar surgery above. Prior to hospital admission, pt was modified independent with mobility, ADL, and IADL. Pt ambulating in room with RW and no assist. Endorses 3/10 pain. Pt able to complete LB ADL tasks with AE to maintain back precautions. Pt instructed in back precautions and how to maintain during ADL and IADL, home/routines modifications for ADL and IADL, car transfers, bed mobility, and AE/DME. Handout provided to support recall and carry over of learned precautions/techniques for bed mobility, functional transfers, and self care skills. No additional skilled OT needs at this time. Will discharge in house. Upon hospital discharge, pt safe to discharge home.   Recommendations for follow up therapy are one component of a multi-disciplinary discharge planning process, led by the attending physician.  Recommendations may be updated based on patient status, additional functional criteria and insurance authorization.   Follow Up Recommendations  No OT follow up    Assistance Recommended at Discharge PRN  Patient can return home with the following Assistance with cooking/housework;Assist for transportation    Functional Status Assessment  Patient has had a recent decline in their functional status and demonstrates the ability to make significant improvements in function in a reasonable and predictable amount of time.  Equipment Recommendations  None recommended by OT    Recommendations for  Other Services       Precautions / Restrictions Precautions Precautions: Back Precaution Booklet Issued: Yes (comment) Restrictions Weight Bearing Restrictions: No      Mobility Bed Mobility               General bed mobility comments: NT, up with nurse tech upon arrival, in recliner at end of session    Transfers Overall transfer level: Needs assistance Equipment used: Rolling walker (2 wheels) Transfers: Sit to/from Stand Sit to Stand: Supervision                  Balance Overall balance assessment: Modified Independent                                         ADL either performed or assessed with clinical judgement   ADL Overall ADL's : Modified independent                                       General ADL Comments: Pt able to complete LB ADL tasks with AE to maintain back precautions with mod indep to supervision assist.     Vision         Perception     Praxis      Pertinent Vitals/Pain Pain Assessment Pain Assessment: 0-10 Pain Score: 3  Pain Location: Low back Pain Descriptors / Indicators: Aching Pain Intervention(s): Limited activity within patient's tolerance, Monitored during session, Premedicated before session, Repositioned     Hand Dominance     Extremity/Trunk Assessment Upper  Extremity Assessment Upper Extremity Assessment: Overall WFL for tasks assessed   Lower Extremity Assessment Lower Extremity Assessment: Overall WFL for tasks assessed   Cervical / Trunk Assessment Cervical / Trunk Assessment: Back Surgery   Communication     Cognition Arousal/Alertness: Awake/alert Behavior During Therapy: WFL for tasks assessed/performed Overall Cognitive Status: Within Functional Limits for tasks assessed                                       General Comments       Exercises Other Exercises Other Exercises: Pt instructed in back precautions and how to maintain during ADL  and IADL, home/routines modifications for ADL and IADL, car transfers, bed mobility, and AE/DME. Handout provided.   Shoulder Instructions      Home Living Family/patient expects to be discharged to:: Private residence Living Arrangements: Spouse/significant other;Children (56yo DTR to be around, husband partly around and working) Available Help at Discharge: Family Type of Home: House Home Access: Stairs to enter [employer] of Steps: 2 partial steps in garage (~4-6 inches) Entrance Stairs-Rails: None Home Layout: One level     Bathroom Shower/Tub: Occupational psychologist: Handicapped height     Home Equipment: Conservation officer, nature (2 wheels);BSC/3in1;Grab bars - tub/shower;Shower seat;Shower seat - built in;Hand held shower head;Adaptive equipment Adaptive Equipment: Reacher;Sock aid;Long-handled shoe horn;Long-handled sponge        Prior Functioning/Environment Prior Level of Function : Working/employed;Driving             Mobility Comments: modI PTA ADLs Comments: modI PTA        OT Problem List: Pain      OT Treatment/Interventions:      OT Goals(Current goals can be found in the care plan section) Acute Rehab OT Goals Patient Stated Goal: go home OT Goal Formulation: All assessment and education complete, DC therapy  OT Frequency:      Co-evaluation              AM-PAC OT "6 Clicks" Daily Activity     Outcome Measure Help from another person eating meals?: None Help from another person taking care of personal grooming?: None Help from another person toileting, which includes using toliet, bedpan, or urinal?: None Help from another person bathing (including washing, rinsing, drying)?: None Help from another person to put on and taking off regular upper body clothing?: None Help from another person to put on and taking off regular lower body clothing?: None 6 Click Score: 24   End of Session    Activity Tolerance: Patient tolerated  treatment well Patient left: in chair;with call bell/phone within reach;with nursing/sitter in room  OT Visit Diagnosis: Other abnormalities of gait and mobility (R26.89)                Time: 0949-1000 OT Time Calculation (min): 11 min Charges:  OT General Charges $OT Visit: 1 Visit OT Evaluation $OT Eval Low Complexity: 1 Low  Ardeth Perfect., MPH, MS, OTR/L ascom [phone] 07/06/21, 10:13 AM

## 2021-07-06 NOTE — Evaluation (Addendum)
Physical Therapy Evaluation Patient Details Name: Kristin Thomas MRN: [id number] DOB: September 30, 1965 Today's Date: 07/06/2021  History of Present Illness  Kristin Thomas is a 56yoF who comes to [facility] for elective lumbar spine surgery. Pt followed as an outpatient by neurosurgical for L5/S1 pseudoarthrosis. Pt taken to OR with Dr. Izora Ribas for removal of S2 alariliac screws, then posterior athrodesis  L5/S1, hardware revision L2/S2  Clinical Impression  Pt admitted with above diagnosis. Pt currently with functional limitations due to the deficits listed below (see "PT Problem List"). Upon entry, pt in chair, awake and agreeable to participate. Pt already eaten and AMB from EOB to chair with ease. The pt is alert, pleasant, interactive, and able to provide info regarding prior level of function, both in tolerance and independence. No assist needed for transfers, AMB or stairs, some education updates for all. Pt able to follow precautions safely. Patient's performance this date reveals decreased ability, independence, and tolerance in performing all basic mobility required for performance of activities of daily living. Pt requires additional DME for safe performance of mobility and ADL. Pt is moving well enough to safely perform ad lib AMB with family assisting if NSG so permits. Pt has no HHPT needs and will defer OPPT needs to her 2 week FU with surgeon. Pt will benefit from skilled PT intervention to increase independence and safety with basic mobility in preparation for discharge to the venue listed below.     No data found.        Recommendations for follow up therapy are one component of a multi-disciplinary discharge planning process, led by the attending physician.  Recommendations may be updated based on patient status, additional functional criteria and insurance authorization.  Follow Up Recommendations Follow physician's recommendations for discharge plan and follow up therapies       Assistance Recommended at Discharge None  Patient can return home with the following  Assistance with cooking/housework    Equipment Recommendations None recommended by PT  Recommendations for Other Services       Functional Status Assessment Patient has had a recent decline in their functional status and demonstrates the ability to make significant improvements in function in a reasonable and predictable amount of time.     Precautions / Restrictions Precautions Precautions: Back Precaution Booklet Issued: No Restrictions Weight Bearing Restrictions: No      Mobility  Bed Mobility                    Transfers Overall transfer level: Needs assistance Equipment used: Rolling walker (2 wheels) (or grab bar) Transfers: Sit to/from Stand Sit to Stand: Modified independent (Device/Increase time)                Ambulation/Gait Ambulation/Gait assistance: Supervision Gait Distance (Feet): 160 Feet Assistive device: Rolling walker (2 wheels) Gait Pattern/deviations: Step-through pattern Gait velocity: appropriate to situation, appropriate to location     General Gait Details: tall posture, safe use of RW, no cues needed, pain well controlled,  Stairs Stairs: Yes Stairs assistance: Supervision Stair Management: One rail Right, Step to pattern Number of Stairs: 2 General stair comments: RLE leading  Wheelchair Mobility    Modified Rankin (Stroke Patients Only)       Balance  Pertinent Vitals/Pain Pain Assessment Pain Assessment: 0-10 Pain Score: 3  Pain Location: Low back sacrum Pain Intervention(s): Limited activity within patient's tolerance, Monitored during session, Premedicated before session, Repositioned    Home Living Family/patient expects to be discharged to:: Private residence Living Arrangements: Spouse/significant other;Children (26yo DTR to be around, husband partly  around and working) Available Help at Discharge: Family Type of Home: House Home Access: Stairs to enter Entrance Stairs-Rails: None Entrance Stairs-Number of Steps: 2 partial steps in garage (~4-6 inches)   Home Layout: One level Home Equipment: Conservation officer, nature (2 wheels);BSC/3in1;Grab bars - tub/shower;Shower seat;Shower seat - built in;Hand held shower head;Adaptive equipment      Prior Function Prior Level of Function : Working/employed;Driving             Mobility Comments: modI PTA ADLs Comments: modI PTA     Hand Dominance        Extremity/Trunk Assessment   Upper Extremity Assessment Upper Extremity Assessment: Overall WFL for tasks assessed    Lower Extremity Assessment Lower Extremity Assessment: Overall WFL for tasks assessed    Cervical / Trunk Assessment Cervical / Trunk Assessment: Back Surgery  Communication      Cognition Arousal/Alertness: Awake/alert Behavior During Therapy: WFL for tasks assessed/performed Overall Cognitive Status: Within Functional Limits for tasks assessed                                          General Comments      Exercises     Assessment/Plan    PT Assessment Patient needs continued PT services  PT Problem List Decreased strength;Decreased activity tolerance;Decreased mobility;Decreased balance       PT Treatment Interventions DME instruction;Balance training;Gait training;Neuromuscular re-education;Stair training;Functional mobility training;Therapeutic activities;Therapeutic exercise;Patient/family education    PT Goals (Current goals can be found in the Care Plan section)  Acute Rehab PT Goals Patient Stated Goal: return to home with help from DTR PT Goal Formulation: With patient Time For Goal Achievement: 07/20/21 Potential to Achieve Goals: Good    Frequency 7X/week     Co-evaluation               AM-PAC PT "6 Clicks" Mobility  Outcome Measure Help needed turning from  your back to your side while in a flat bed without using bedrails?: A Little Help needed moving from lying on your back to sitting on the side of a flat bed without using bedrails?: A Little Help needed moving to and from a bed to a chair (including a wheelchair)?: None Help needed standing up from a chair using your arms (e.g., wheelchair or bedside chair)?: None Help needed to walk in hospital room?: None Help needed climbing 3-5 steps with a railing? : A Little 6 Click Score: 21    End of Session Equipment Utilized During Treatment: Gait belt Activity Tolerance: Patient tolerated treatment well;No increased pain Patient left: in chair;with nursing/sitter in room;with call bell/phone within reach Nurse Communication: Mobility status PT Visit Diagnosis: Difficulty in walking, not elsewhere classified (R26.2)    Time: 3419-6222 PT Time Calculation (min) (ACUTE ONLY): 32 min   Charges:   PT Evaluation $PT Eval Moderate Complexity: 1 Mod PT Treatments $Gait Training: 8-22 mins $Therapeutic Activity: 8-22 mins       11:11 AM, 07/06/21 Etta Grandchild, PT, DPT Physical Therapist - [facility]  [phone] [facility])  Tyr Franca C 07/06/2021, 11:07 AM

## 2021-07-06 NOTE — Progress Notes (Signed)
    Attending Progress Note  History: Kristin Thomas is s/p emoval of S2 alariliac screws, L2-S2 fusion   Physical Exam: Vitals:   07/06/21 0357 07/06/21 0834  BP: 113/66 118/72  Pulse: 82 81  Resp: 18 16  Temp: 98 F (36.7 C) 98.2 F (36.8 C)  SpO2: 99% 100%    AA Ox3 CNI  Strength:5/5 throughout BLE HV 420  Data:  No results for input(s): "NA", "K", "CL", "CO2", "BUN", "CREATININE", "LABGLOM", "GLUCOSE", "CALCIUM" in the last 168 hours. No results for input(s): "AST", "ALT", "ALKPHOS" in the last 168 hours.  Invalid input(s): "TBILI"   No results for input(s): "WBC", "HGB", "HCT", "PLT" in the last 168 hours. No results for input(s): "APTT", "INR" in the last 168 hours.       Other tests/results: none   Assessment/Plan:  Kristin Thomas is a 56 y.o presenting with pseudarthrosis s/p L2-S2 fusion   - mobilize - pain control - DVT prophylaxis - PTOT - Will continue drain for now  Cooper Render PA-C Department of Neurosurgery

## 2021-07-06 NOTE — Progress Notes (Signed)
Patient from home with her husband Per PT, Pt is moving well enough to safely perform ad lib AMB with family assisting if NSG so permits. Pt has no HHPT needs and will defer OPPT needs to her 2 week FU with surgeon  She has DME at home and doe snot need additional, Her spouse will provide transportation

## 2021-07-07 MED ORDER — ACETAMINOPHEN 325 MG PO TABS
650.0000 mg | ORAL_TABLET | ORAL | Status: DC | PRN
Start: 2021-07-07 — End: 2021-07-07
  Administered 2021-07-07: 650 mg via ORAL
  Filled 2021-07-07: qty 2

## 2021-07-07 MED ORDER — OXYCODONE HCL 5 MG PO TABS: 5.0000 mg | ORAL_TABLET | ORAL | 0 refills | Status: AC | PRN

## 2021-07-07 MED ORDER — METHOCARBAMOL 500 MG PO TABS: 500.0000 mg | ORAL_TABLET | Freq: Four times a day (QID) | ORAL | 0 refills | Status: DC

## 2021-07-20 ENCOUNTER — Encounter: Payer: Self-pay | Admitting: Neurosurgery

## 2021-07-20 ENCOUNTER — Ambulatory Visit (INDEPENDENT_AMBULATORY_CARE_PROVIDER_SITE_OTHER): Payer: BC Managed Care – PPO | Admitting: Neurosurgery

## 2021-07-20 VITALS — BP 125/86 | HR 96 | Temp 98.3°F

## 2021-07-20 DIAGNOSIS — Z981 Arthrodesis status: Secondary | ICD-10-CM

## 2021-07-20 DIAGNOSIS — M960 Pseudarthrosis after fusion or arthrodesis: Secondary | ICD-10-CM

## 2021-07-20 DIAGNOSIS — Z09 Encounter for follow-up examination after completed treatment for conditions other than malignant neoplasm: Secondary | ICD-10-CM

## 2021-07-20 NOTE — Progress Notes (Signed)
   REFERRING PHYSICIAN:  Rusty Aus, Md [address] [facility] [place],  [place] [address]  DOS: 07/05/21 removal of S2 screws. Revision of L5-S1 fusion  HISTORY OF PRESENT ILLNESS: Kristin Thomas is approximately 2 weeks status post lumbar fusion. she is doing well.  She is very pleased with her postoperative recovery thus far and has had very minimal back or leg pain.  She denies any incisional concerns.  She is not currently taking any medications for pain.  PHYSICAL EXAMINATION:  General: Patient is well developed, well nourished, calm, collected, and in no apparent distress.   NEUROLOGICAL:  General: In no acute distress.   Awake, alert, oriented to person, place, and time.  Pupils equal round and reactive to light.  Facial tone is symmetric.  Tongue protrusion is midline.  There is no pronator drift.   Strength:            Side Iliopsoas Quads Hamstring PF DF EHL  R '5 5 5 5 5 5  '$ L '5 5 5 5 5 5   '$ Incision c/d/I and healing well    ROS (Neurologic):  Negative except as noted above  IMAGING: No interval imaging to review  ASSESSMENT/PLAN:  Kristin Thomas is doing well approximately 2 weeks after lumbar fusion.  We discussed activity escalation and I have advised the patient to lift up to 10 pounds until 6 weeks after surgery, then increase up to 25 pounds until 12 weeks after surgery.  After 12 weeks post-op, the patient advised to increase activity as tolerated. she will follow up with Dr. Izora Ribas in 4 weeks with lumbar xrays prior.  She was encouraged to call the office in the interim with any questions or concerns.  She expressed understanding and was in agreement with this plan.  Cooper Render PA-C Department of neurosurgery

## 2021-08-09 ENCOUNTER — Encounter: Payer: Self-pay | Admitting: Neurosurgery

## 2021-08-10 ENCOUNTER — Encounter: Payer: Self-pay | Admitting: Neurosurgery

## 2021-08-23 ENCOUNTER — Other Ambulatory Visit: Payer: Self-pay

## 2021-08-23 DIAGNOSIS — Z981 Arthrodesis status: Secondary | ICD-10-CM

## 2021-08-24 ENCOUNTER — Ambulatory Visit
Admission: RE | Admit: 2021-08-24 | Discharge: 2021-08-24 | Disposition: A | Discharge status: Home / Self Care | Payer: BC Managed Care – PPO | Source: Ambulatory Visit | Attending: Neurosurgery | Admitting: Neurosurgery

## 2021-08-24 ENCOUNTER — Ambulatory Visit
Admission: RE | Admit: 2021-08-24 | Discharge: 2021-08-24 | Disposition: A | Discharge status: Home / Self Care | Payer: BC Managed Care – PPO | Attending: Neurosurgery | Admitting: Neurosurgery

## 2021-08-24 ENCOUNTER — Encounter: Payer: Self-pay | Admitting: Neurosurgery

## 2021-08-24 ENCOUNTER — Ambulatory Visit: Payer: BC Managed Care – PPO | Admitting: Neurosurgery

## 2021-08-24 VITALS — BP 124/78 | Ht 65.0 in | Wt 160.0 lb

## 2021-08-24 DIAGNOSIS — Z981 Arthrodesis status: Secondary | ICD-10-CM | POA: Insufficient documentation

## 2021-08-24 MED ORDER — METHOCARBAMOL 500 MG PO TABS: 500.0000 mg | ORAL_TABLET | Freq: Four times a day (QID) | ORAL | 0 refills | Status: DC

## 2021-08-24 NOTE — Progress Notes (Signed)
   REFERRING PHYSICIAN:  Meade Maw, [address] [place],  [place] [address]  DOS: 07/05/21 removal of S2 screws. Revision of L5-S1 fusion  HISTORY OF PRESENT ILLNESS: Kristin Thomas is status post lumbar fusion. she is doing well.    PHYSICAL EXAMINATION:  General: Patient is well developed, well nourished, calm, collected, and in no apparent distress.   NEUROLOGICAL:  General: In no acute distress.   Awake, alert, oriented to person, place, and time.  Pupils equal round and reactive to light.     Strength:            Side Iliopsoas Quads Hamstring PF DF EHL  R '5 5 5 5 5 5  '$ L '5 5 5 5 5 5   '$ Incision c/d/I and healing well    ROS (Neurologic):  Negative except as noted above  IMAGING: No complications noted  ASSESSMENT/PLAN:  Kristin Thomas is doing well after lumbar fusion.  We discussed activity escalation.  I refilled her muscle relaxants.  I will see her back in 6 weeks.  She will return to work next week on a part-time schedule.    Meade Maw MD Department of neurosurgery

## 2021-10-11 ENCOUNTER — Other Ambulatory Visit: Payer: Self-pay

## 2021-10-11 DIAGNOSIS — Z981 Arthrodesis status: Secondary | ICD-10-CM

## 2021-10-12 ENCOUNTER — Ambulatory Visit
Admission: RE | Admit: 2021-10-12 | Discharge: 2021-10-12 | Disposition: A | Discharge status: Home / Self Care | Payer: BC Managed Care – PPO | Source: Ambulatory Visit | Attending: Neurosurgery | Admitting: Neurosurgery

## 2021-10-12 ENCOUNTER — Ambulatory Visit
Admission: RE | Admit: 2021-10-12 | Discharge: 2021-10-12 | Disposition: A | Discharge status: Home / Self Care | Payer: BC Managed Care – PPO | Attending: Neurosurgery | Admitting: Neurosurgery

## 2021-10-12 ENCOUNTER — Encounter: Payer: Self-pay | Admitting: Neurosurgery

## 2021-10-12 ENCOUNTER — Ambulatory Visit (INDEPENDENT_AMBULATORY_CARE_PROVIDER_SITE_OTHER): Payer: BC Managed Care – PPO | Admitting: Neurosurgery

## 2021-10-12 VITALS — BP 132/76 | Ht 65.0 in | Wt 160.0 lb

## 2021-10-12 DIAGNOSIS — Z09 Encounter for follow-up examination after completed treatment for conditions other than malignant neoplasm: Secondary | ICD-10-CM | POA: Diagnosis not present

## 2021-10-12 DIAGNOSIS — Z981 Arthrodesis status: Secondary | ICD-10-CM | POA: Diagnosis not present

## 2021-10-12 DIAGNOSIS — M960 Pseudarthrosis after fusion or arthrodesis: Secondary | ICD-10-CM

## 2021-10-12 MED ORDER — METHOCARBAMOL 500 MG PO TABS: 500.0000 mg | ORAL_TABLET | Freq: Four times a day (QID) | ORAL | 0 refills | Status: DC

## 2021-10-12 NOTE — Progress Notes (Signed)
   REFERRING PHYSICIAN:  Rusty Aus, Md [address] [facility] [place],  [place] [address]  DOS: 07/05/21 removal of S2 screws. Revision of L5-S1 fusion  HISTORY OF PRESENT ILLNESS: CINA KLUMPP is status post lumbar fusion. she is doing well.  She is taking an occasional Tylenol and muscle relaxant for back discomfort, but has returned to her regular activities.  PHYSICAL EXAMINATION:  General: Patient is well developed, well nourished, calm, collected, and in no apparent distress.   NEUROLOGICAL:  General: In no acute distress.   Awake, alert, oriented to person, place, and time.  Pupils equal round and reactive to light.     Strength:            Side Iliopsoas Quads Hamstring PF DF EHL  R '5 5 5 5 5 5  '$ L '5 5 5 5 5 5   '$ Incision c/d/I and healing well    ROS (Neurologic):  Negative except as noted above  IMAGING: No complications noted  ASSESSMENT/PLAN:  JUSTA HATCHELL is doing well after lumbar fusion.  We discussed activity escalation.  She is off activity limitations.  I refilled her muscle relaxants.  I will see her back in 6 months.    Meade Maw MD Department of neurosurgery

## 2021-12-13 ENCOUNTER — Other Ambulatory Visit: Payer: Self-pay | Admitting: Internal Medicine

## 2021-12-13 DIAGNOSIS — Z1231 Encounter for screening mammogram for malignant neoplasm of breast: Secondary | ICD-10-CM

## 2021-12-15 ENCOUNTER — Ambulatory Visit
Admission: RE | Admit: 2021-12-15 | Discharge: 2021-12-15 | Disposition: A | Discharge status: Home / Self Care | Payer: BC Managed Care – PPO | Source: Ambulatory Visit | Attending: Internal Medicine | Admitting: Internal Medicine

## 2021-12-15 DIAGNOSIS — Z1231 Encounter for screening mammogram for malignant neoplasm of breast: Secondary | ICD-10-CM | POA: Insufficient documentation

## 2022-01-23 ENCOUNTER — Other Ambulatory Visit: Payer: Self-pay | Admitting: Neurosurgery

## 2022-01-24 MED ORDER — METHOCARBAMOL 500 MG PO TABS: 500.0000 mg | ORAL_TABLET | Freq: Four times a day (QID) | ORAL | 0 refills | Status: AC

## 2022-04-10 ENCOUNTER — Other Ambulatory Visit: Payer: Self-pay

## 2022-04-10 DIAGNOSIS — Z981 Arthrodesis status: Secondary | ICD-10-CM

## 2022-04-12 ENCOUNTER — Encounter: Payer: Self-pay | Admitting: Neurosurgery

## 2022-04-12 ENCOUNTER — Ambulatory Visit
Admission: RE | Admit: 2022-04-12 | Discharge: 2022-04-12 | Disposition: A | Discharge status: Home / Self Care | Payer: BC Managed Care – PPO | Attending: Neurosurgery | Admitting: Neurosurgery

## 2022-04-12 ENCOUNTER — Ambulatory Visit
Admission: RE | Admit: 2022-04-12 | Discharge: 2022-04-12 | Disposition: A | Discharge status: Home / Self Care | Payer: BC Managed Care – PPO | Source: Ambulatory Visit | Attending: Neurosurgery | Admitting: Neurosurgery

## 2022-04-12 ENCOUNTER — Ambulatory Visit: Payer: BC Managed Care – PPO | Admitting: Neurosurgery

## 2022-04-12 VITALS — BP 120/72 | Ht 65.0 in | Wt 160.0 lb

## 2022-04-12 DIAGNOSIS — Z981 Arthrodesis status: Secondary | ICD-10-CM | POA: Diagnosis not present

## 2022-04-12 DIAGNOSIS — M960 Pseudarthrosis after fusion or arthrodesis: Secondary | ICD-10-CM

## 2022-04-12 NOTE — Progress Notes (Signed)
   REFERRING PHYSICIAN:  Rusty Aus, Md [address] [facility] [place],   [address]  DOS: 07/05/21 removal of S2 screws. Revision of L5-S1 fusion  HISTORY OF PRESENT ILLNESS: Kristin Thomas is status post lumbar fusion. she is doing well.  She has pain in her left anterior thigh at times.  Overall, she is doing well.    PHYSICAL EXAMINATION:  General: Patient is well developed, well nourished, calm, collected, and in no apparent distress.   NEUROLOGICAL:  General: In no acute distress.   Awake, alert, oriented to person, place, and time.  Pupils equal round and reactive to light.     Strength:            Side Iliopsoas Quads Hamstring PF DF EHL  R 5 5 5 5 5 5   L 5 5 5 5 5 5    Incision c/d/I and healing well    ROS (Neurologic):  Negative except as noted above  IMAGING: No complications noted  ASSESSMENT/PLAN:  Kristin Thomas is doing well after lumbar fusion.    I will see her back as needed.   Meade Maw MD Department of neurosurgery

## 2022-07-02 ENCOUNTER — Encounter: Payer: Self-pay | Admitting: Neurosurgery

## 2022-12-05 ENCOUNTER — Other Ambulatory Visit: Payer: Self-pay | Admitting: Internal Medicine

## 2022-12-05 DIAGNOSIS — Z0000 Encounter for general adult medical examination without abnormal findings: Secondary | ICD-10-CM

## 2022-12-05 DIAGNOSIS — E782 Mixed hyperlipidemia: Secondary | ICD-10-CM

## 2022-12-11 ENCOUNTER — Ambulatory Visit
Admission: RE | Admit: 2022-12-11 | Discharge: 2022-12-11 | Disposition: A | Discharge status: Home / Self Care | Payer: BC Managed Care – PPO | Source: Ambulatory Visit | Attending: Internal Medicine | Admitting: Internal Medicine

## 2022-12-11 DIAGNOSIS — Z0000 Encounter for general adult medical examination without abnormal findings: Secondary | ICD-10-CM | POA: Insufficient documentation

## 2022-12-11 DIAGNOSIS — E782 Mixed hyperlipidemia: Secondary | ICD-10-CM | POA: Insufficient documentation

## 2023-01-07 ENCOUNTER — Ambulatory Visit
Admission: RE | Admit: 2023-01-07 | Discharge: 2023-01-07 | Disposition: A | Discharge status: Home / Self Care | Payer: BC Managed Care – PPO | Source: Ambulatory Visit | Attending: Internal Medicine | Admitting: Internal Medicine

## 2023-01-07 ENCOUNTER — Other Ambulatory Visit: Payer: Self-pay | Admitting: Internal Medicine

## 2023-01-07 DIAGNOSIS — Z1231 Encounter for screening mammogram for malignant neoplasm of breast: Secondary | ICD-10-CM | POA: Diagnosis present

## 2023-06-08 IMAGING — CT CT L SPINE W/O CM
3 of 6 series · 10 of 36 positions shown, 11 images · non-contrast
Comparison: CT myelogram 10/14/2020

CLINICAL DATA: Back pain.  History of thoracolumbar fusion.

EXAM:
CT THORACIC AND LUMBAR SPINE WITHOUT CONTRAST
TECHNIQUE: Multidetector CT imaging of the thoracic and lumbar spine was
performed without contrast. Multiplanar CT image reconstructions
were also generated.
RADIATION DOSE REDUCTION: This exam was performed according to the
departmental dose-optimization program which includes automated
exposure control, adjustment of the mA and/or kV according to
patient size and/or use of iterative reconstruction technique.

[Series 4: (person_name) 2.00 · axial · 0.37mm/px · z∈[-1373,-1197]mm · 2 of 177 slices shown, 3 images]
[im 45/177  soft-tissue]
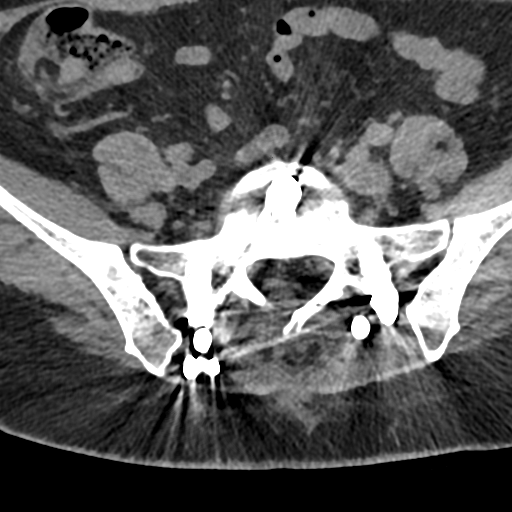
[im 45/177  bone]
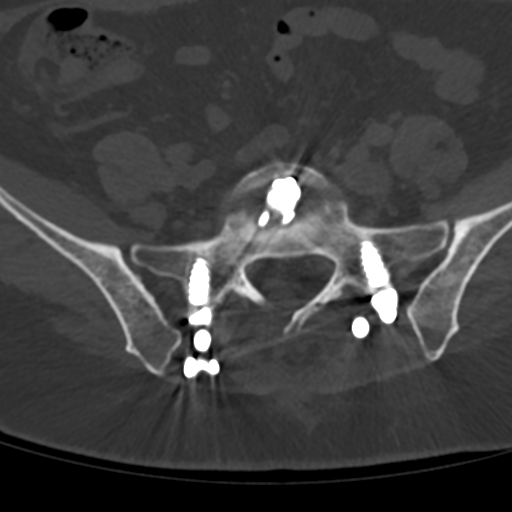
[im 133/177  bone]
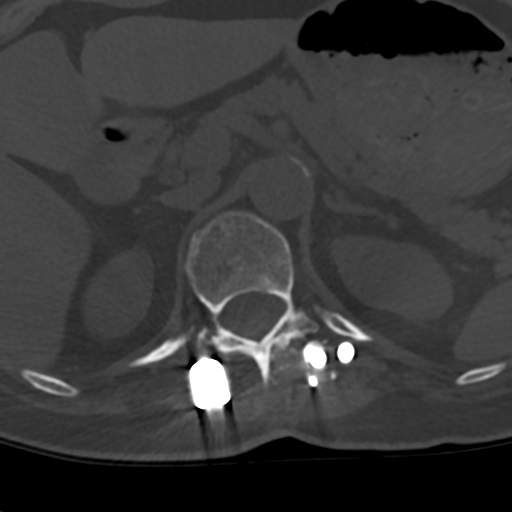

[Series 6: sag bone l-spine 2.00 sag · sagittal · 0.34mm/px · 5 of 97 slices shown]
[im 17/97  bone]
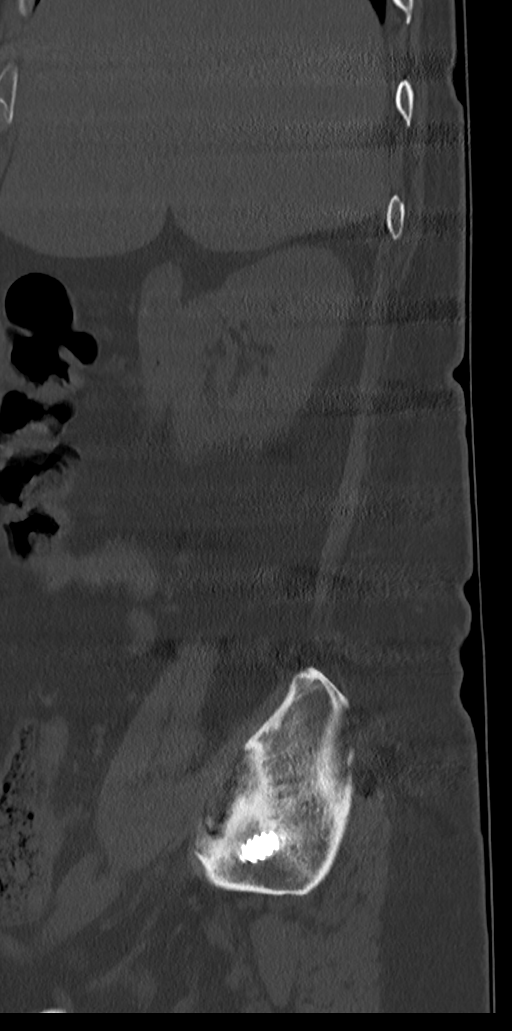
[im 33/97  bone]
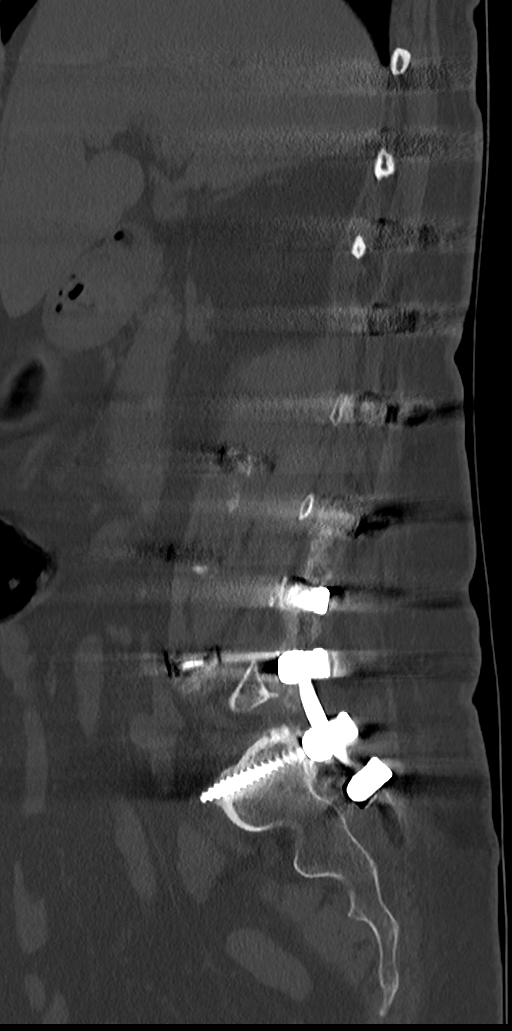
[im 49/97  bone]
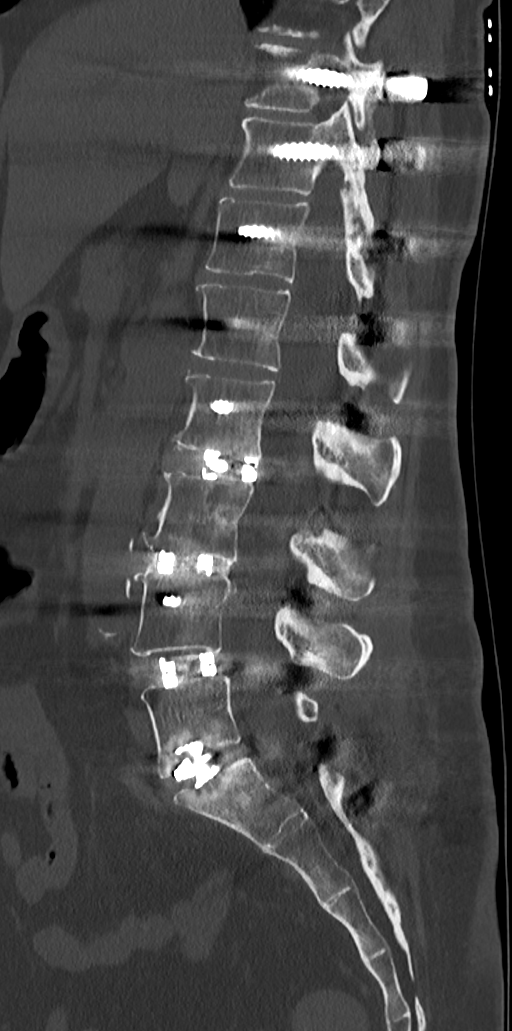
[im 65/97  bone]
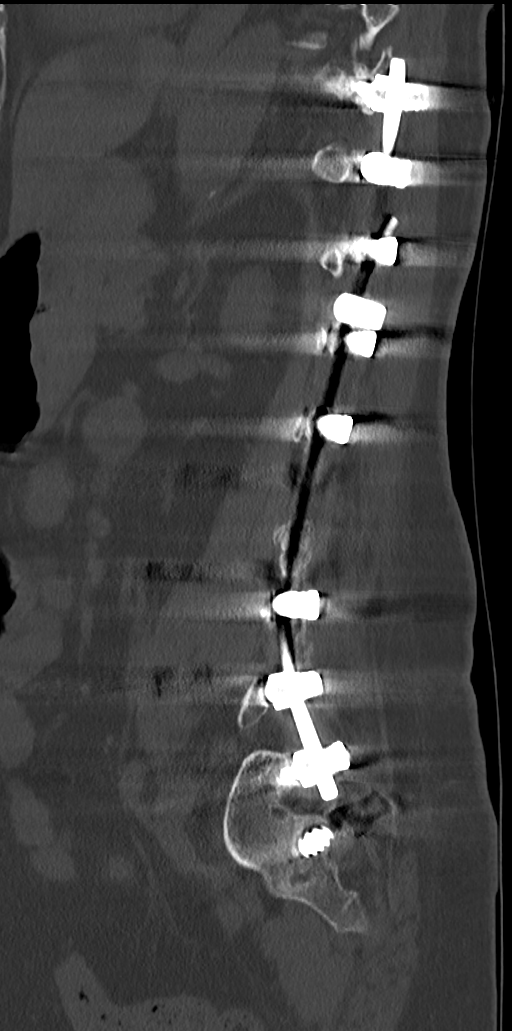
[im 81/97  bone]
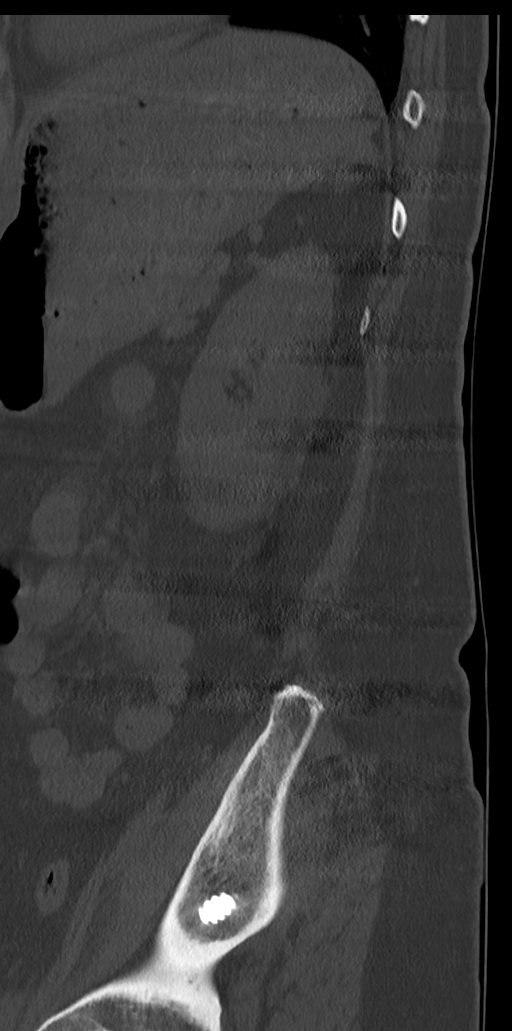

[Series 10: cor bone l-spine 2.00 cor · coronal · 0.39mm/px · 3 of 87 slices shown]
[im 18/87  bone]
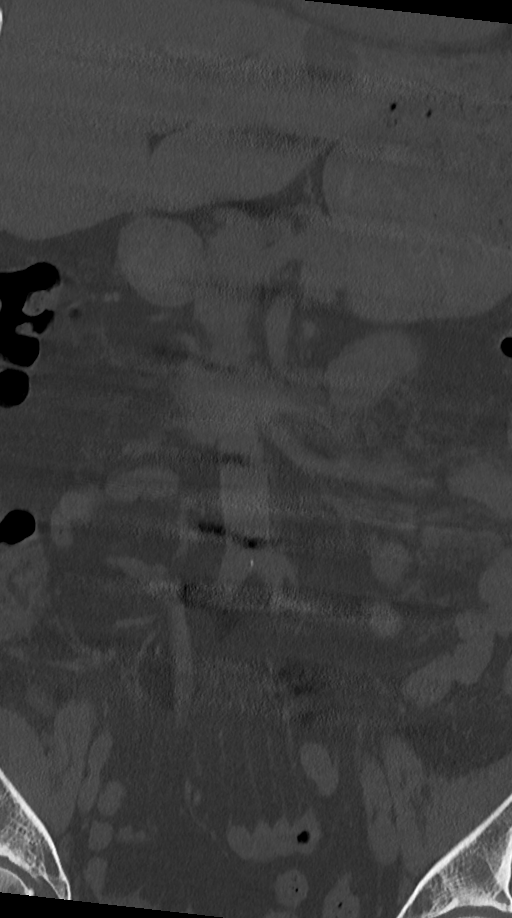
[im 35/87  bone]
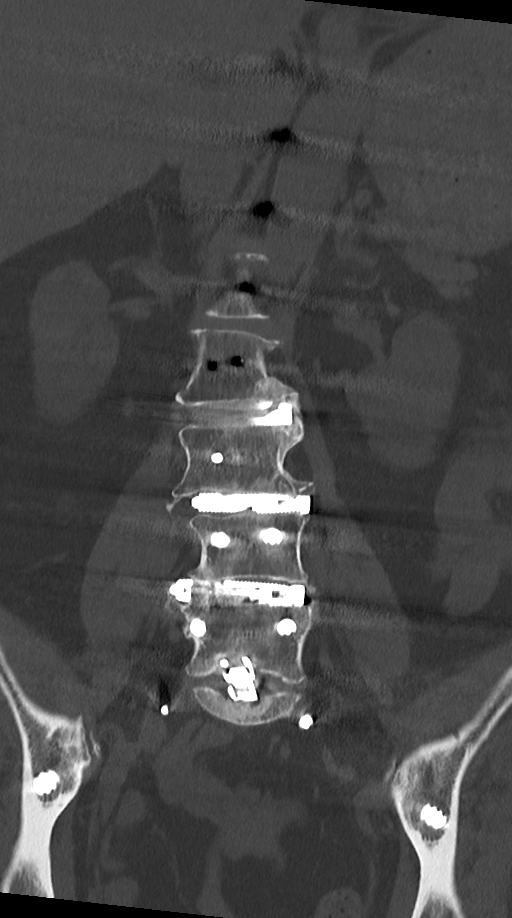
[im 52/87  bone]
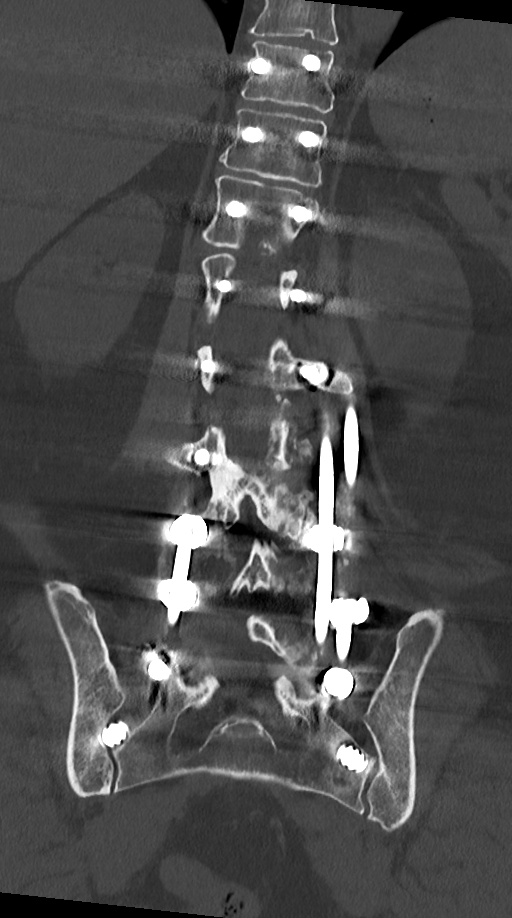

[10 of 36 positions shown; findings below may reference images not displayed]

FINDINGS: CT THORACIC SPINE FINDINGS

Alignment: Normal and stable.

Vertebrae: No bone lesions or fractures. Fusion hardware beginning
at the T10 level is intact. No complicating features are identified.

Paraspinal and other soft tissues: No significant paraspinal or
posterior mediastinal abnormalities. The visualized posterior lungs
are grossly clear. No pulmonary lesions or pleural effusions. No
aortic aneurysm. The visualized posterior ribs are intact.

Disc levels: No large disc protrusions, significant spinal or
foraminal stenosis is demonstrated. Stable ossification of the
posterior longitudinal ligament from the top of T6 to the midbody
region of T7. No significant mass effect on the thecal sac. The
spinal fusion hardware is intact. No complicating features are
identified.

CT LUMBAR SPINE FINDINGS

Segmentation: There are five lumbar type vertebral bodies. The last
full intervertebral disc space is labeled L5-S1. This correlates
with the prior study.

Alignment: Normal

Vertebrae: No significant bony findings. Stable appearing fusion
hardware from T10 down through the sacrum. No complicating features
such as loosening or hardware fracture. The left pedicle screws at
L1 and L2 are lateral to the pedicle. This is a stable finding. The
other pedicle screws are normally positioned.

Paraspinal and other soft tissues: No significant paraspinal or
retroperitoneal findings. Scattered aortic calcifications but no
aneurysm.

Disc levels: Wide decompressive laminectomies without findings for
spinal or foraminal stenosis. Partial osseous interbody fusion
changes.
IMPRESSION: 1. Stable appearing fusion hardware from T10 down through the sacrum
without complicating features such as loosening or hardware
fracture.
2. The left L1 and L2 pedicles are lateral to the pedicle but this
is a stable finding.
3. Stable ossification of the posterior longitudinal ligament from
the top of T6 to the midbody region of T7. No significant mass
effect on the thecal sac.
4. No large disc protrusions, significant spinal or foraminal
stenosis.

## 2023-06-08 IMAGING — CT CT T SPINE W/O CM
3 of 5 series · 9 of 33 positions shown, 10 images · non-contrast
Comparison: CT myelogram 10/14/2020

CLINICAL DATA: Back pain.  History of thoracolumbar fusion.

EXAM:
CT THORACIC AND LUMBAR SPINE WITHOUT CONTRAST
TECHNIQUE: Multidetector CT imaging of the thoracic and lumbar spine was
performed without contrast. Multiplanar CT image reconstructions
were also generated.
RADIATION DOSE REDUCTION: This exam was performed according to the
departmental dose-optimization program which includes automated
exposure control, adjustment of the mA and/or kV according to
patient size and/or use of iterative reconstruction technique.

[Series 4: (person_name) 2.00 · axial · 0.24mm/px · z∈[-1067,-1067]mm · 1 of 157 slices shown, 2 images]
[im 79/157  soft-tissue]
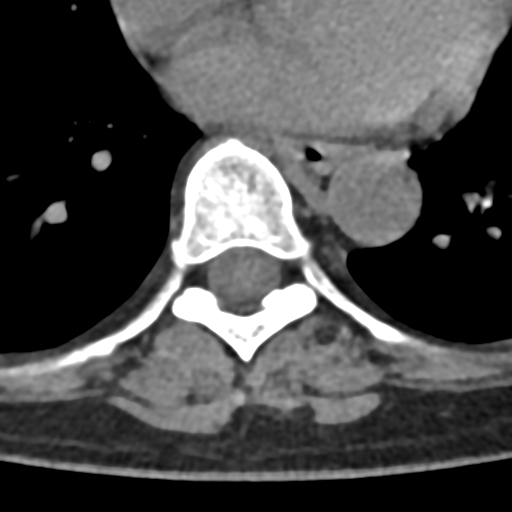
[im 79/157  bone]
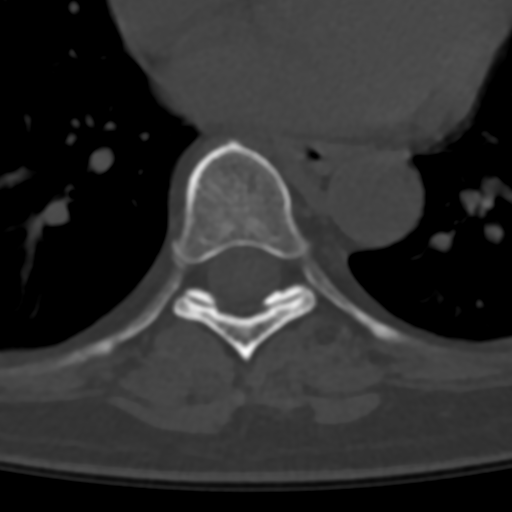

[Series 6: sag bone t-spine 2.00 sag · sagittal · 0.29mm/px · 5 of 77 slices shown]
[im 26/77  bone]
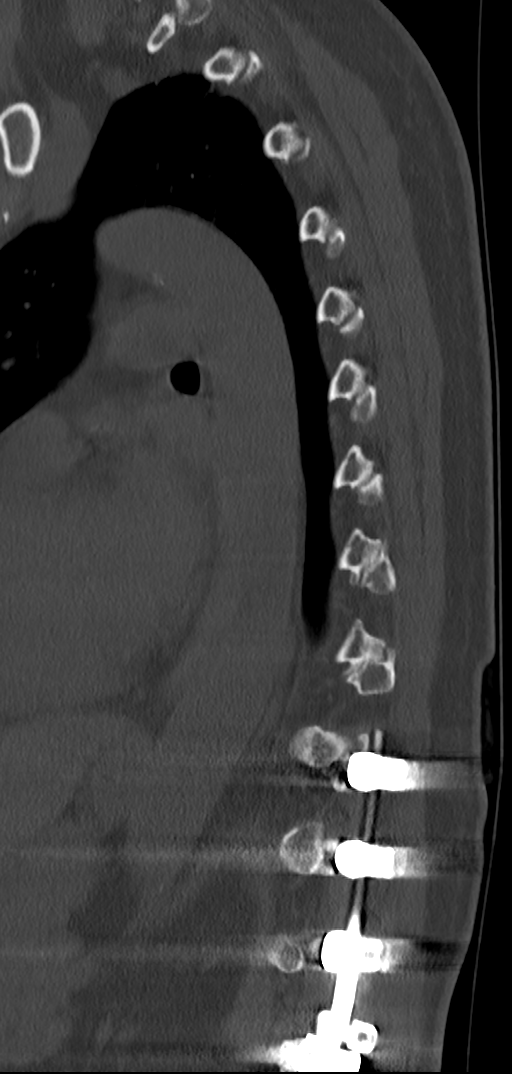
[im 32/77  bone]
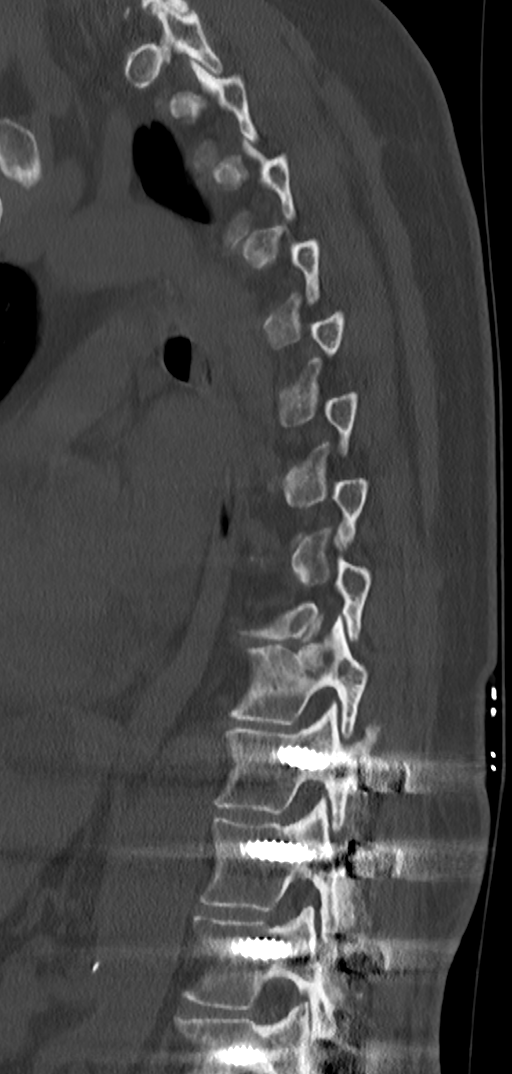
[im 39/77  bone]
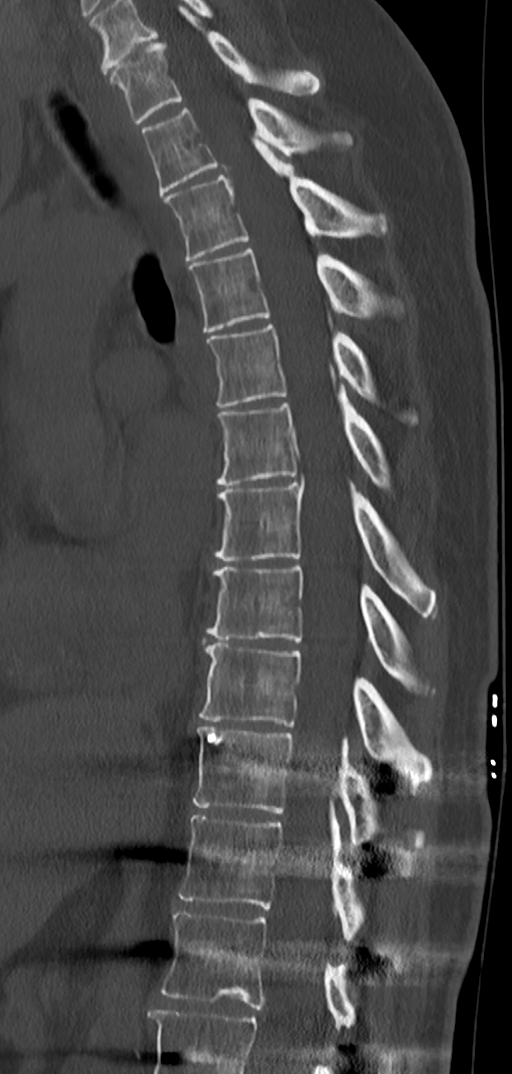
[im 45/77  bone]
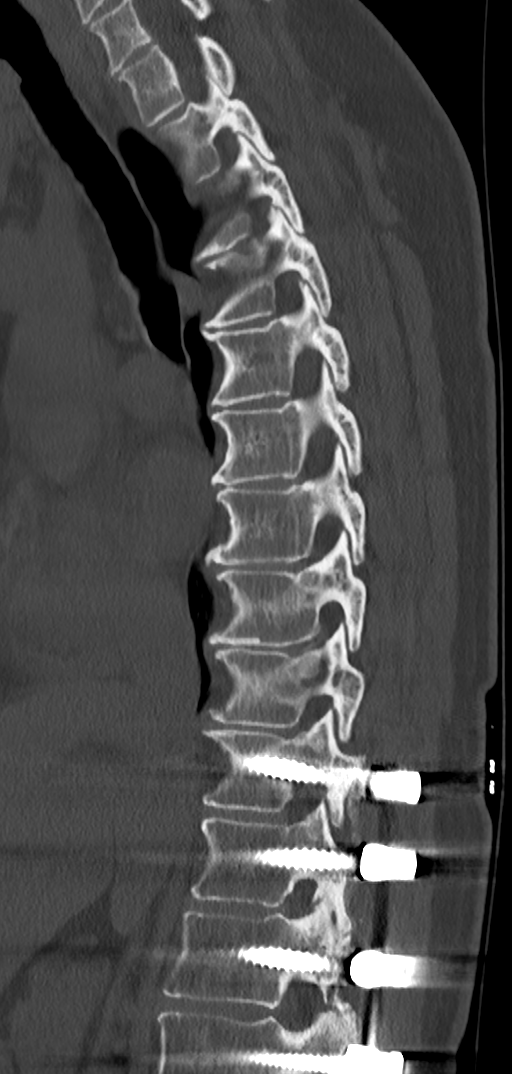
[im 51/77  bone]
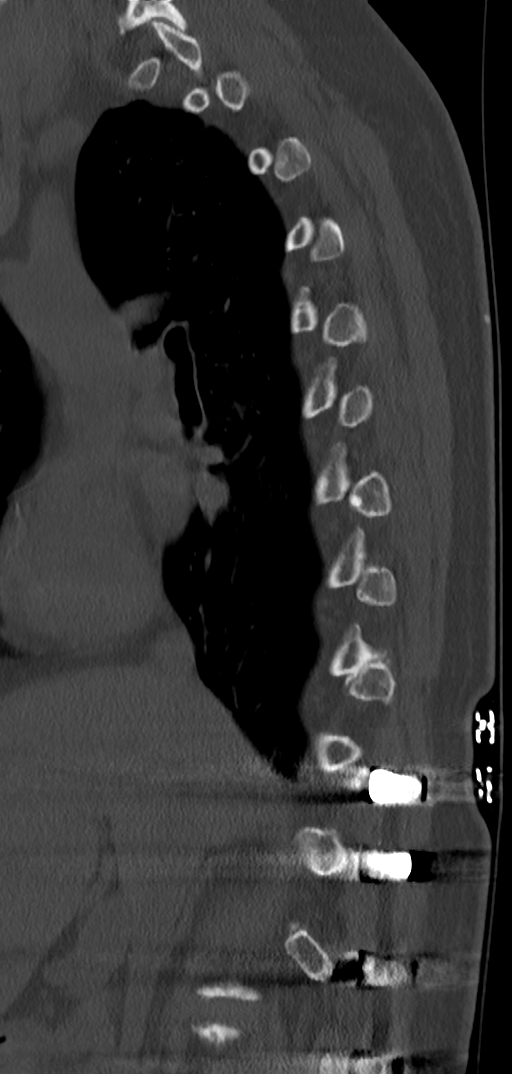

[Series 8: cor bone t-spine 2.00 cor · coronal · 0.30mm/px · 3 of 75 slices shown]
[im 15/75  bone]
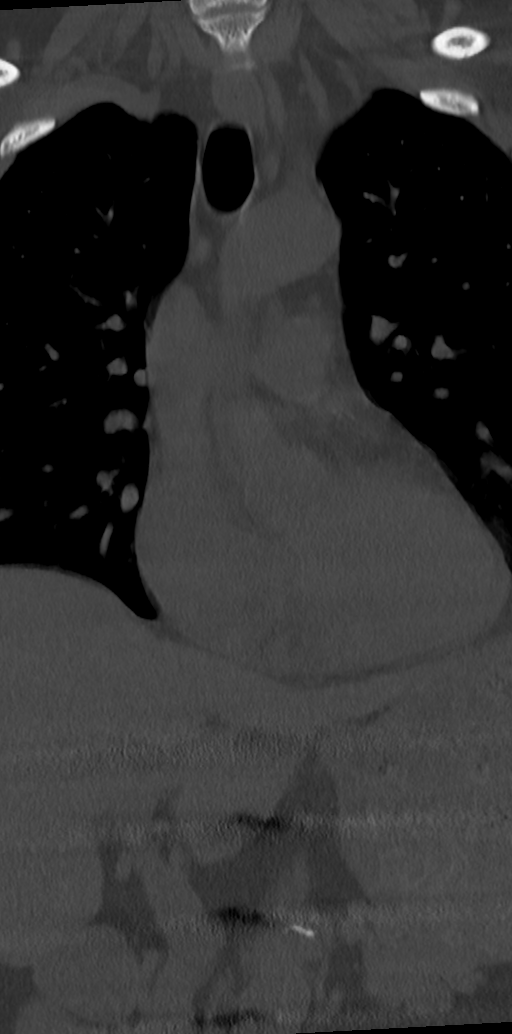
[im 30/75  bone]
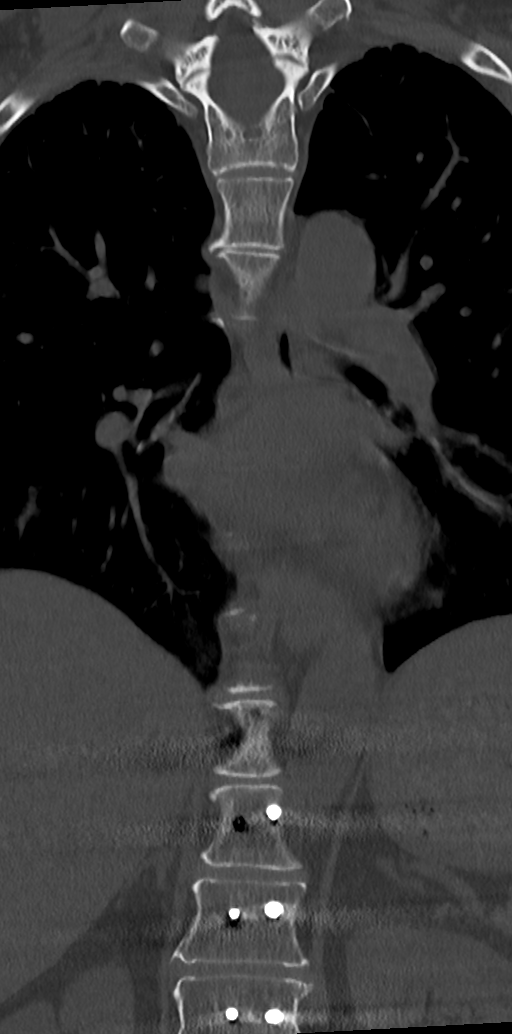
[im 45/75  bone]
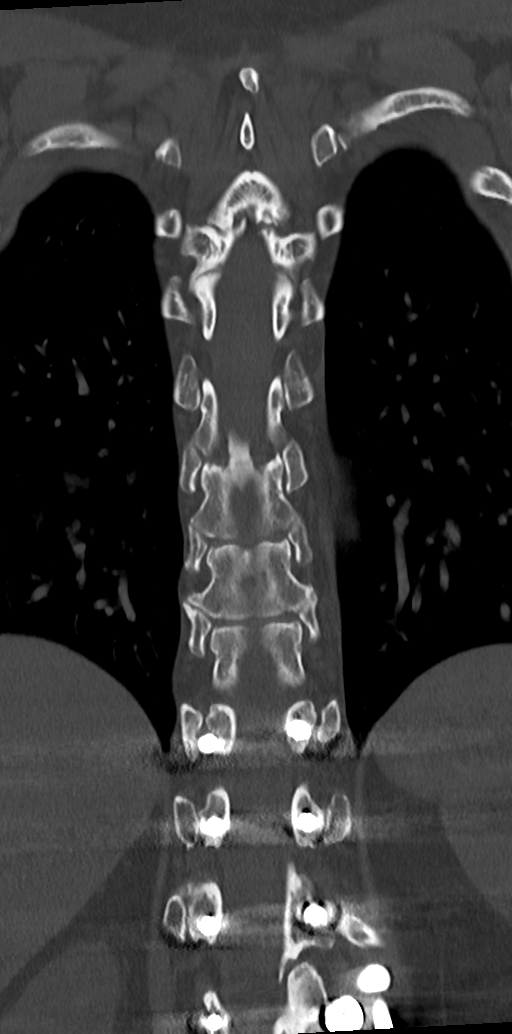

[9 of 33 positions shown; findings below may reference images not displayed]

FINDINGS: CT THORACIC SPINE FINDINGS

Alignment: Normal and stable.

Vertebrae: No bone lesions or fractures. Fusion hardware beginning
at the T10 level is intact. No complicating features are identified.

Paraspinal and other soft tissues: No significant paraspinal or
posterior mediastinal abnormalities. The visualized posterior lungs
are grossly clear. No pulmonary lesions or pleural effusions. No
aortic aneurysm. The visualized posterior ribs are intact.

Disc levels: No large disc protrusions, significant spinal or
foraminal stenosis is demonstrated. Stable ossification of the
posterior longitudinal ligament from the top of T6 to the midbody
region of T7. No significant mass effect on the thecal sac. The
spinal fusion hardware is intact. No complicating features are
identified.

CT LUMBAR SPINE FINDINGS

Segmentation: There are five lumbar type vertebral bodies. The last
full intervertebral disc space is labeled L5-S1. This correlates
with the prior study.

Alignment: Normal

Vertebrae: No significant bony findings. Stable appearing fusion
hardware from T10 down through the sacrum. No complicating features
such as loosening or hardware fracture. The left pedicle screws at
L1 and L2 are lateral to the pedicle. This is a stable finding. The
other pedicle screws are normally positioned.

Paraspinal and other soft tissues: No significant paraspinal or
retroperitoneal findings. Scattered aortic calcifications but no
aneurysm.

Disc levels: Wide decompressive laminectomies without findings for
spinal or foraminal stenosis. Partial osseous interbody fusion
changes.
IMPRESSION: 1. Stable appearing fusion hardware from T10 down through the sacrum
without complicating features such as loosening or hardware
fracture.
2. The left L1 and L2 pedicles are lateral to the pedicle but this
is a stable finding.
3. Stable ossification of the posterior longitudinal ligament from
the top of T6 to the midbody region of T7. No significant mass
effect on the thecal sac.
4. No large disc protrusions, significant spinal or foraminal
stenosis.

## 2023-08-21 ENCOUNTER — Other Ambulatory Visit: Payer: Self-pay | Admitting: Internal Medicine

## 2023-08-21 DIAGNOSIS — G959 Disease of spinal cord, unspecified: Secondary | ICD-10-CM

## 2023-08-22 ENCOUNTER — Ambulatory Visit
Admission: RE | Admit: 2023-08-22 | Discharge: 2023-08-22 | Disposition: A | Discharge status: Home / Self Care | Source: Ambulatory Visit | Attending: Internal Medicine | Admitting: Internal Medicine

## 2023-08-22 DIAGNOSIS — G959 Disease of spinal cord, unspecified: Secondary | ICD-10-CM

## 2023-12-31 ENCOUNTER — Other Ambulatory Visit: Payer: Self-pay | Admitting: Internal Medicine

## 2023-12-31 DIAGNOSIS — Z1231 Encounter for screening mammogram for malignant neoplasm of breast: Secondary | ICD-10-CM

## 2024-01-10 ENCOUNTER — Ambulatory Visit
Admission: RE | Admit: 2024-01-10 | Discharge: 2024-01-10 | Disposition: A | Discharge status: Home / Self Care | Source: Ambulatory Visit | Attending: Internal Medicine | Admitting: Internal Medicine

## 2024-01-10 DIAGNOSIS — Z1231 Encounter for screening mammogram for malignant neoplasm of breast: Secondary | ICD-10-CM | POA: Insufficient documentation
